# Patient Record
Sex: Male | Born: 1950 | Race: White | Hispanic: Yes | Marital: Married | Smoking: Former smoker
Health system: Southern US, Community
[De-identification: ages and names within clinical notes are randomized; demographics above are authoritative.]

## PROBLEM LIST (undated history)

## (undated) DIAGNOSIS — E119 Type 2 diabetes mellitus without complications: Secondary | ICD-10-CM

## (undated) DIAGNOSIS — E785 Hyperlipidemia, unspecified: Secondary | ICD-10-CM

## (undated) DIAGNOSIS — I10 Essential (primary) hypertension: Secondary | ICD-10-CM

## (undated) DIAGNOSIS — I2510 Atherosclerotic heart disease of native coronary artery without angina pectoris: Secondary | ICD-10-CM

## (undated) HISTORY — PX: PACEMAKER INSERTION: SHX728

## (undated) HISTORY — PX: CORONARY ARTERY BYPASS GRAFT: SHX141

---

## 2019-05-26 ENCOUNTER — Other Ambulatory Visit: Payer: Self-pay

## 2019-05-26 DIAGNOSIS — Z20822 Contact with and (suspected) exposure to covid-19: Secondary | ICD-10-CM

## 2019-05-26 NOTE — Addendum Note (Signed)
Addended by: Terence Lux on: 05/26/2019 10:39 AM   Modules accepted: Orders

## 2019-05-29 ENCOUNTER — Telehealth: Payer: Self-pay | Admitting: *Deleted

## 2019-05-29 LAB — SPECIMEN STATUS REPORT

## 2019-05-29 LAB — NOVEL CORONAVIRUS, NAA: SARS-CoV-2, NAA: DETECTED — AB

## 2019-05-29 NOTE — Telephone Encounter (Signed)
Call to daughter- [employer]  She took the whole family for testing due to exposure and everyone has tested + COVID. Patient was not having symptoms at time of testing- but has since developed minor/mild symptoms of COVID. Reviewed isolation recommendations, OTC treatment, and when to seek care from medical professional. Patient is visiting from [place]- so he does not have PCP in area and advised to see UC/health dept is needed. Patient is aware health dept may be following up with family

## 2019-06-02 ENCOUNTER — Emergency Department (HOSPITAL_COMMUNITY): Payer: HRSA Program

## 2019-06-02 ENCOUNTER — Encounter (HOSPITAL_COMMUNITY): Payer: Self-pay | Admitting: Emergency Medicine

## 2019-06-02 ENCOUNTER — Other Ambulatory Visit: Payer: Self-pay

## 2019-06-02 ENCOUNTER — Inpatient Hospital Stay (HOSPITAL_COMMUNITY)
Admission: EM | Admit: 2019-06-02 | Discharge: 2019-07-08 | DRG: 207 | Disposition: E | Payer: HRSA Program | Attending: Internal Medicine | Admitting: Internal Medicine

## 2019-06-02 DIAGNOSIS — Z8249 Family history of ischemic heart disease and other diseases of the circulatory system: Secondary | ICD-10-CM | POA: Diagnosis not present

## 2019-06-02 DIAGNOSIS — Z7189 Other specified counseling: Secondary | ICD-10-CM | POA: Diagnosis not present

## 2019-06-02 DIAGNOSIS — Z794 Long term (current) use of insulin: Secondary | ICD-10-CM | POA: Diagnosis not present

## 2019-06-02 DIAGNOSIS — J9601 Acute respiratory failure with hypoxia: Secondary | ICD-10-CM

## 2019-06-02 DIAGNOSIS — I472 Ventricular tachycardia: Secondary | ICD-10-CM | POA: Diagnosis not present

## 2019-06-02 DIAGNOSIS — R4189 Other symptoms and signs involving cognitive functions and awareness: Secondary | ICD-10-CM | POA: Diagnosis not present

## 2019-06-02 DIAGNOSIS — I429 Cardiomyopathy, unspecified: Secondary | ICD-10-CM | POA: Diagnosis present

## 2019-06-02 DIAGNOSIS — I2510 Atherosclerotic heart disease of native coronary artery without angina pectoris: Secondary | ICD-10-CM | POA: Diagnosis present

## 2019-06-02 DIAGNOSIS — J189 Pneumonia, unspecified organism: Secondary | ICD-10-CM | POA: Diagnosis not present

## 2019-06-02 DIAGNOSIS — E1110 Type 2 diabetes mellitus with ketoacidosis without coma: Secondary | ICD-10-CM | POA: Diagnosis not present

## 2019-06-02 DIAGNOSIS — Z9911 Dependence on respirator [ventilator] status: Secondary | ICD-10-CM | POA: Diagnosis not present

## 2019-06-02 DIAGNOSIS — D649 Anemia, unspecified: Secondary | ICD-10-CM | POA: Diagnosis present

## 2019-06-02 DIAGNOSIS — Z992 Dependence on renal dialysis: Secondary | ICD-10-CM

## 2019-06-02 DIAGNOSIS — N179 Acute kidney failure, unspecified: Secondary | ICD-10-CM

## 2019-06-02 DIAGNOSIS — Z87891 Personal history of nicotine dependence: Secondary | ICD-10-CM | POA: Diagnosis not present

## 2019-06-02 DIAGNOSIS — I5023 Acute on chronic systolic (congestive) heart failure: Secondary | ICD-10-CM | POA: Diagnosis present

## 2019-06-02 DIAGNOSIS — I469 Cardiac arrest, cause unspecified: Secondary | ICD-10-CM | POA: Diagnosis not present

## 2019-06-02 DIAGNOSIS — Z6835 Body mass index (BMI) 35.0-35.9, adult: Secondary | ICD-10-CM

## 2019-06-02 DIAGNOSIS — R680 Hypothermia, not associated with low environmental temperature: Secondary | ICD-10-CM | POA: Diagnosis not present

## 2019-06-02 DIAGNOSIS — U071 COVID-19: Principal | ICD-10-CM

## 2019-06-02 DIAGNOSIS — N170 Acute kidney failure with tubular necrosis: Secondary | ICD-10-CM | POA: Diagnosis present

## 2019-06-02 DIAGNOSIS — Z515 Encounter for palliative care: Secondary | ICD-10-CM | POA: Diagnosis not present

## 2019-06-02 DIAGNOSIS — Z950 Presence of cardiac pacemaker: Secondary | ICD-10-CM | POA: Diagnosis not present

## 2019-06-02 DIAGNOSIS — E669 Obesity, unspecified: Secondary | ICD-10-CM | POA: Diagnosis present

## 2019-06-02 DIAGNOSIS — J988 Other specified respiratory disorders: Secondary | ICD-10-CM | POA: Diagnosis not present

## 2019-06-02 DIAGNOSIS — J80 Acute respiratory distress syndrome: Secondary | ICD-10-CM | POA: Diagnosis not present

## 2019-06-02 DIAGNOSIS — D6489 Other specified anemias: Secondary | ICD-10-CM | POA: Diagnosis not present

## 2019-06-02 DIAGNOSIS — E875 Hyperkalemia: Secondary | ICD-10-CM | POA: Diagnosis not present

## 2019-06-02 DIAGNOSIS — R34 Anuria and oliguria: Secondary | ICD-10-CM | POA: Diagnosis not present

## 2019-06-02 DIAGNOSIS — E785 Hyperlipidemia, unspecified: Secondary | ICD-10-CM | POA: Diagnosis present

## 2019-06-02 DIAGNOSIS — E874 Mixed disorder of acid-base balance: Secondary | ICD-10-CM | POA: Diagnosis not present

## 2019-06-02 DIAGNOSIS — I5021 Acute systolic (congestive) heart failure: Secondary | ICD-10-CM | POA: Diagnosis not present

## 2019-06-02 DIAGNOSIS — R570 Cardiogenic shock: Secondary | ICD-10-CM | POA: Diagnosis not present

## 2019-06-02 DIAGNOSIS — G9341 Metabolic encephalopathy: Secondary | ICD-10-CM | POA: Diagnosis not present

## 2019-06-02 DIAGNOSIS — D696 Thrombocytopenia, unspecified: Secondary | ICD-10-CM | POA: Diagnosis not present

## 2019-06-02 DIAGNOSIS — I110 Hypertensive heart disease with heart failure: Secondary | ICD-10-CM | POA: Diagnosis present

## 2019-06-02 DIAGNOSIS — Z66 Do not resuscitate: Secondary | ICD-10-CM | POA: Diagnosis not present

## 2019-06-02 DIAGNOSIS — Z978 Presence of other specified devices: Secondary | ICD-10-CM

## 2019-06-02 DIAGNOSIS — J1289 Other viral pneumonia: Secondary | ICD-10-CM | POA: Diagnosis present

## 2019-06-02 DIAGNOSIS — Z7982 Long term (current) use of aspirin: Secondary | ICD-10-CM

## 2019-06-02 DIAGNOSIS — I10 Essential (primary) hypertension: Secondary | ICD-10-CM | POA: Diagnosis present

## 2019-06-02 DIAGNOSIS — I4901 Ventricular fibrillation: Secondary | ICD-10-CM | POA: Diagnosis not present

## 2019-06-02 DIAGNOSIS — Z951 Presence of aortocoronary bypass graft: Secondary | ICD-10-CM

## 2019-06-02 DIAGNOSIS — R0603 Acute respiratory distress: Secondary | ICD-10-CM

## 2019-06-02 DIAGNOSIS — R0902 Hypoxemia: Secondary | ICD-10-CM

## 2019-06-02 DIAGNOSIS — E1165 Type 2 diabetes mellitus with hyperglycemia: Secondary | ICD-10-CM | POA: Diagnosis not present

## 2019-06-02 DIAGNOSIS — Z6831 Body mass index (BMI) 31.0-31.9, adult: Secondary | ICD-10-CM

## 2019-06-02 DIAGNOSIS — G931 Anoxic brain damage, not elsewhere classified: Secondary | ICD-10-CM | POA: Diagnosis not present

## 2019-06-02 DIAGNOSIS — E119 Type 2 diabetes mellitus without complications: Secondary | ICD-10-CM | POA: Diagnosis not present

## 2019-06-02 DIAGNOSIS — E43 Unspecified severe protein-calorie malnutrition: Secondary | ICD-10-CM | POA: Diagnosis present

## 2019-06-02 HISTORY — DX: Essential (primary) hypertension: I10

## 2019-06-02 HISTORY — DX: Hyperlipidemia, unspecified: E78.5

## 2019-06-02 HISTORY — DX: Type 2 diabetes mellitus without complications: E11.9

## 2019-06-02 HISTORY — DX: Atherosclerotic heart disease of native coronary artery without angina pectoris: I25.10

## 2019-06-02 LAB — TROPONIN I (HIGH SENSITIVITY)
Troponin I (High Sensitivity): 123 ng/L (ref <18)
Troponin I (High Sensitivity): 127 ng/L (ref <18)

## 2019-06-02 LAB — COMPREHENSIVE METABOLIC PANEL
ALT: 34 U/L (ref 0–44)
AST: 109 U/L — ABNORMAL HIGH (ref 15–41)
Albumin: 2.9 g/dL — ABNORMAL LOW (ref 3.5–5.0)
Alkaline Phosphatase: 136 U/L — ABNORMAL HIGH (ref 38–126)
Anion gap: 19 — ABNORMAL HIGH (ref 5–15)
BUN: 69 mg/dL — ABNORMAL HIGH (ref 8–23)
CO2: 20 mmol/L — ABNORMAL LOW (ref 22–32)
Calcium: 7.6 mg/dL — ABNORMAL LOW (ref 8.9–10.3)
Chloride: 95 mmol/L — ABNORMAL LOW (ref 98–111)
Creatinine, Ser: 2.34 mg/dL — ABNORMAL HIGH (ref 0.61–1.24)
GFR calc Af Amer: 33 mL/min — ABNORMAL LOW (ref >60)
GFR calc non Af Amer: 28 mL/min — ABNORMAL LOW (ref >60)
Glucose, Bld: 264 mg/dL — ABNORMAL HIGH (ref 70–99)
Potassium: 4.1 mmol/L (ref 3.5–5.1)
Sodium: 134 mmol/L — ABNORMAL LOW (ref 135–145)
Total Bilirubin: 0.8 mg/dL (ref 0.3–1.2)
Total Protein: 7 g/dL (ref 6.5–8.1)

## 2019-06-02 LAB — CBC WITH DIFFERENTIAL/PLATELET
Abs Immature Granulocytes: 0.03 10*3/uL (ref 0.00–0.07)
Basophils Absolute: 0 10*3/uL (ref 0.0–0.1)
Basophils Relative: 0 %
Eosinophils Absolute: 0 10*3/uL (ref 0.0–0.5)
Eosinophils Relative: 0 %
HCT: 39.7 % (ref 39.0–52.0)
Hemoglobin: 13.4 g/dL (ref 13.0–17.0)
Immature Granulocytes: 1 %
Lymphocytes Relative: 5 %
Lymphs Abs: 0.3 10*3/uL — ABNORMAL LOW (ref 0.7–4.0)
MCH: 30.1 pg (ref 26.0–34.0)
MCHC: 33.8 g/dL (ref 30.0–36.0)
MCV: 89.2 fL (ref 80.0–100.0)
Monocytes Absolute: 0.2 10*3/uL (ref 0.1–1.0)
Monocytes Relative: 3 %
Neutro Abs: 5.6 10*3/uL (ref 1.7–7.7)
Neutrophils Relative %: 91 %
Platelets: 115 10*3/uL — ABNORMAL LOW (ref 150–400)
RBC: 4.45 MIL/uL (ref 4.22–5.81)
RDW: 14.1 % (ref 11.5–15.5)
WBC: 6.1 10*3/uL (ref 4.0–10.5)
nRBC: 0 % (ref 0.0–0.2)

## 2019-06-02 LAB — STREP PNEUMONIAE URINARY ANTIGEN: Strep Pneumo Urinary Antigen: NEGATIVE

## 2019-06-02 LAB — FERRITIN: Ferritin: 2645 ng/mL — ABNORMAL HIGH (ref 24–336)

## 2019-06-02 LAB — BASIC METABOLIC PANEL
Anion gap: 17 — ABNORMAL HIGH (ref 5–15)
Anion gap: 15 (ref 5–15)
BUN: 68 mg/dL — ABNORMAL HIGH (ref 8–23)
BUN: 59 mg/dL — ABNORMAL HIGH (ref 8–23)
CO2: 20 mmol/L — ABNORMAL LOW (ref 22–32)
CO2: 20 mmol/L — ABNORMAL LOW (ref 22–32)
Calcium: 7.5 mg/dL — ABNORMAL LOW (ref 8.9–10.3)
Calcium: 7.8 mg/dL — ABNORMAL LOW (ref 8.9–10.3)
Chloride: 100 mmol/L (ref 98–111)
Chloride: 104 mmol/L (ref 98–111)
Creatinine, Ser: 2 mg/dL — ABNORMAL HIGH (ref 0.61–1.24)
Creatinine, Ser: 1.56 mg/dL — ABNORMAL HIGH (ref 0.61–1.24)
GFR calc Af Amer: 39 mL/min — ABNORMAL LOW (ref >60)
GFR calc Af Amer: 53 mL/min — ABNORMAL LOW (ref >60)
GFR calc non Af Amer: 34 mL/min — ABNORMAL LOW (ref >60)
GFR calc non Af Amer: 46 mL/min — ABNORMAL LOW (ref >60)
Glucose, Bld: 175 mg/dL — ABNORMAL HIGH (ref 70–99)
Glucose, Bld: 187 mg/dL — ABNORMAL HIGH (ref 70–99)
Potassium: 4.2 mmol/L (ref 3.5–5.1)
Potassium: 4.3 mmol/L (ref 3.5–5.1)
Sodium: 137 mmol/L (ref 135–145)
Sodium: 139 mmol/L (ref 135–145)

## 2019-06-02 LAB — URINALYSIS, ROUTINE W REFLEX MICROSCOPIC
Bilirubin Urine: NEGATIVE
Glucose, UA: 50 mg/dL — AB
Ketones, ur: 5 mg/dL — AB
Leukocytes,Ua: NEGATIVE
Nitrite: NEGATIVE
Protein, ur: 100 mg/dL — AB
Specific Gravity, Urine: 1.017 (ref 1.005–1.030)
pH: 5 (ref 5.0–8.0)

## 2019-06-02 LAB — LACTIC ACID, PLASMA
Lactic Acid, Venous: 2.3 mmol/L (ref 0.5–1.9)
Lactic Acid, Venous: 1.2 mmol/L (ref 0.5–1.9)

## 2019-06-02 LAB — MRSA PCR SCREENING: MRSA by PCR: NEGATIVE

## 2019-06-02 LAB — GLUCOSE, CAPILLARY
Glucose-Capillary: 150 mg/dL — ABNORMAL HIGH (ref 70–99)
Glucose-Capillary: 195 mg/dL — ABNORMAL HIGH (ref 70–99)
Glucose-Capillary: 168 mg/dL — ABNORMAL HIGH (ref 70–99)
Glucose-Capillary: 157 mg/dL — ABNORMAL HIGH (ref 70–99)
Glucose-Capillary: 187 mg/dL — ABNORMAL HIGH (ref 70–99)
Glucose-Capillary: 181 mg/dL — ABNORMAL HIGH (ref 70–99)

## 2019-06-02 LAB — TYPE AND SCREEN
ABO/RH(D): O POS
ABO/RH(D): O POS
Antibody Screen: NEGATIVE
Antibody Screen: NEGATIVE

## 2019-06-02 LAB — BRAIN NATRIURETIC PEPTIDE
B Natriuretic Peptide: 108.4 pg/mL — ABNORMAL HIGH (ref 0.0–100.0)
B Natriuretic Peptide: 92.8 pg/mL (ref 0.0–100.0)

## 2019-06-02 LAB — C-REACTIVE PROTEIN: CRP: 18.6 mg/dL — ABNORMAL HIGH (ref <1.0)

## 2019-06-02 LAB — PROTIME-INR
INR: 1 (ref 0.8–1.2)
Prothrombin Time: 12.8 seconds (ref 11.4–15.2)

## 2019-06-02 LAB — CBG MONITORING, ED
Glucose-Capillary: 183 mg/dL — ABNORMAL HIGH (ref 70–99)
Glucose-Capillary: 147 mg/dL — ABNORMAL HIGH (ref 70–99)
Glucose-Capillary: 153 mg/dL — ABNORMAL HIGH (ref 70–99)

## 2019-06-02 LAB — LACTATE DEHYDROGENASE: LDH: 1112 U/L — ABNORMAL HIGH (ref 98–192)

## 2019-06-02 LAB — ABO/RH: ABO/RH(D): O POS

## 2019-06-02 LAB — PROCALCITONIN: Procalcitonin: 0.72 ng/mL

## 2019-06-02 LAB — SEDIMENTATION RATE: Sed Rate: 94 mm/hr — ABNORMAL HIGH (ref 0–16)

## 2019-06-02 LAB — SARS CORONAVIRUS 2 BY RT PCR (HOSPITAL ORDER, PERFORMED IN ~~LOC~~ HOSPITAL LAB): SARS Coronavirus 2: POSITIVE — AB

## 2019-06-02 MED ORDER — HYDRALAZINE HCL 20 MG/ML IJ SOLN
10.0000 mg | Freq: Four times a day (QID) | INTRAMUSCULAR | Status: DC | PRN
  Administered 2019-06-05 – 2019-06-06 (×2): 10 mg via INTRAVENOUS
  Filled 2019-06-02 (×2): qty 1

## 2019-06-02 MED ORDER — TOCILIZUMAB 400 MG/20ML IV SOLN
8.0000 mg/kg | Freq: Once | INTRAVENOUS | Status: AC
  Administered 2019-06-02: 16:00:00 580 mg via INTRAVENOUS
  Filled 2019-06-02: qty 20

## 2019-06-02 MED ORDER — DEXTROSE-NACL 5-0.45 % IV SOLN
INTRAVENOUS | Status: DC
  Administered 2019-06-02 – 2019-06-05 (×5): via INTRAVENOUS

## 2019-06-02 MED ORDER — FAMOTIDINE 20 MG PO TABS
20.0000 mg | ORAL_TABLET | Freq: Every day | ORAL | Status: DC
  Administered 2019-06-02 – 2019-06-03 (×2): 20 mg via ORAL
  Filled 2019-06-02 (×2): qty 1

## 2019-06-02 MED ORDER — INSULIN REGULAR BOLUS VIA INFUSION
0.0000 [IU] | Freq: Three times a day (TID) | INTRAVENOUS | Status: DC
  Filled 2019-06-02: qty 10

## 2019-06-02 MED ORDER — ADULT MULTIVITAMIN W/MINERALS CH
1.0000 | ORAL_TABLET | Freq: Every day | ORAL | Status: DC
  Administered 2019-06-02 – 2019-06-08 (×6): 1 via ORAL
  Filled 2019-06-02 (×6): qty 1

## 2019-06-02 MED ORDER — DOCUSATE SODIUM 100 MG PO CAPS
100.0000 mg | ORAL_CAPSULE | Freq: Every day | ORAL | Status: DC
  Administered 2019-06-02 – 2019-06-04 (×3): 100 mg via ORAL
  Filled 2019-06-02 (×3): qty 1

## 2019-06-02 MED ORDER — ATORVASTATIN CALCIUM 40 MG PO TABS
40.0000 mg | ORAL_TABLET | Freq: Every day | ORAL | Status: DC
  Administered 2019-06-02 – 2019-06-04 (×3): 40 mg via ORAL
  Filled 2019-06-02 (×3): qty 1

## 2019-06-02 MED ORDER — VITAMIN C 500 MG PO TABS
1000.0000 mg | ORAL_TABLET | Freq: Every day | ORAL | Status: DC
  Administered 2019-06-02 – 2019-06-04 (×3): 1000 mg via ORAL
  Filled 2019-06-02 (×5): qty 2

## 2019-06-02 MED ORDER — ENOXAPARIN SODIUM 30 MG/0.3ML ~~LOC~~ SOLN: 30.0000 mg | SUBCUTANEOUS | Status: DC

## 2019-06-02 MED ORDER — VITAMIN D 25 MCG (1000 UNIT) PO TABS
1000.0000 [IU] | ORAL_TABLET | Freq: Every day | ORAL | Status: DC
  Administered 2019-06-02 – 2019-06-04 (×3): 1000 [IU] via ORAL
  Filled 2019-06-02 (×4): qty 1

## 2019-06-02 MED ORDER — SODIUM CHLORIDE 0.9 % IV BOLUS
500.0000 mL | Freq: Once | INTRAVENOUS | Status: AC
  Administered 2019-06-02: 500 mL via INTRAVENOUS

## 2019-06-02 MED ORDER — ASPIRIN EC 81 MG PO TBEC
81.0000 mg | DELAYED_RELEASE_TABLET | Freq: Every day | ORAL | Status: DC
  Administered 2019-06-02 – 2019-06-04 (×3): 81 mg via ORAL
  Filled 2019-06-02 (×4): qty 1

## 2019-06-02 MED ORDER — SODIUM CHLORIDE 0.9 % IV SOLN
500.0000 mg | Freq: Every day | INTRAVENOUS | Status: DC
  Administered 2019-06-02: 500 mg via INTRAVENOUS
  Filled 2019-06-02: qty 500

## 2019-06-02 MED ORDER — VITAMIN D 25 MCG (1000 UNIT) PO TABS: 1000.0000 [IU] | ORAL_TABLET | Freq: Every day | ORAL | Status: DC

## 2019-06-02 MED ORDER — DEXTROSE 50 % IV SOLN: 25.0000 mL | INTRAVENOUS | Status: DC | PRN

## 2019-06-02 MED ORDER — CHLORHEXIDINE GLUCONATE CLOTH 2 % EX PADS
6.0000 | MEDICATED_PAD | Freq: Every day | CUTANEOUS | Status: DC
  Administered 2019-06-03 – 2019-06-08 (×6): 6 via TOPICAL

## 2019-06-02 MED ORDER — SODIUM CHLORIDE 0.9 % IV SOLN: INTRAVENOUS | Status: DC

## 2019-06-02 MED ORDER — METOPROLOL SUCCINATE ER 50 MG PO TB24
50.0000 mg | ORAL_TABLET | Freq: Every day | ORAL | Status: DC
  Administered 2019-06-02 – 2019-06-04 (×3): 50 mg via ORAL
  Filled 2019-06-02 (×4): qty 1

## 2019-06-02 MED ORDER — SODIUM CHLORIDE 0.9 % IV SOLN
2.0000 g | Freq: Every day | INTRAVENOUS | Status: DC
  Administered 2019-06-02: 2 g via INTRAVENOUS
  Filled 2019-06-02: qty 20

## 2019-06-02 MED ORDER — ORAL CARE MOUTH RINSE
15.0000 mL | Freq: Two times a day (BID) | OROMUCOSAL | Status: DC
  Administered 2019-06-02 – 2019-06-03 (×2): 15 mL via OROMUCOSAL

## 2019-06-02 MED ORDER — DEXAMETHASONE SODIUM PHOSPHATE 10 MG/ML IJ SOLN
6.0000 mg | Freq: Every day | INTRAMUSCULAR | Status: DC
  Administered 2019-06-02 – 2019-06-03 (×2): 6 mg via INTRAVENOUS
  Filled 2019-06-02 (×2): qty 1

## 2019-06-02 MED ORDER — INSULIN REGULAR(HUMAN) IN NACL 100-0.9 UT/100ML-% IV SOLN
INTRAVENOUS | Status: DC
  Administered 2019-06-02: 09:00:00 1.2 [IU]/h via INTRAVENOUS
  Filled 2019-06-02: qty 100

## 2019-06-02 MED ORDER — SODIUM CHLORIDE 0.9 % IV SOLN
100.0000 mg | INTRAVENOUS | Status: AC
  Administered 2019-06-03 – 2019-06-06 (×4): 100 mg via INTRAVENOUS
  Filled 2019-06-02 (×4): qty 20

## 2019-06-02 MED ORDER — SODIUM CHLORIDE 0.9 % IV SOLN
200.0000 mg | Freq: Once | INTRAVENOUS | Status: AC
  Administered 2019-06-02: 200 mg via INTRAVENOUS
  Filled 2019-06-02: qty 40

## 2019-06-02 MED ORDER — HEPARIN SODIUM (PORCINE) 10000 UNIT/ML IJ SOLN
7500.0000 [IU] | Freq: Three times a day (TID) | INTRAMUSCULAR | Status: DC
  Administered 2019-06-02 – 2019-06-03 (×3): 7500 [IU] via SUBCUTANEOUS
  Filled 2019-06-02 (×3): qty 1

## 2019-06-02 NOTE — ED Notes (Signed)
Attempted report to [facility] x2.

## 2019-06-02 NOTE — Progress Notes (Signed)
CRITICAL VALUE ALERT  Critical Value:  Troponin = 123  Date & Time Notied:  05/20/2019 1306  Provider Notified: Dr. Maryland Pink  Orders Received/Actions taken: Awaiting response at this time.

## 2019-06-02 NOTE — Progress Notes (Signed)
Pt's SpO2's are upper 80's on 15L [place], have changed his Admit order to ICU at [facility].    Kelly Splinter MD  [facility]  pgr [phone]  06/06/2019, 10:39 AM

## 2019-06-02 NOTE — ED Notes (Signed)
[facility] at bedside 

## 2019-06-02 NOTE — ED Notes (Signed)
Date and time results received: 05/11/2019 2:07 AM  (use smartphrase ".now" to insert current time)  Test: Lactic Acid Critical Value: 2.3  Name of Provider Notified: [place]  Orders Received? Or Actions Taken?: none

## 2019-06-02 NOTE — Progress Notes (Signed)
PROGRESS NOTE  Robert Mcdaniel UMP:[id number] DOB: 1951/10/06 DOA: 05/12/2019  PCP: Patient, No Pcp Per  Brief History/Interval Summary: 68 year old male who speaks only Spanish with a past medical history of coronary artery disease, diabetes mellitus type 2 on insulin, possible chronic kidney disease unspecified stage presented with complains of shortness of breath dry cough.  Patient had nausea vomiting a few days ago.  Patient was found to be positive for COVID-19.  Chest x-ray showed pneumonia.  Patient was hospitalized for further management.  He was hypoxic.    Reason for Visit: Acute respiratory disease due to COVID-19  Consultants: Pulmonology  Procedures: None  Antibiotics: Anti-infectives (From admission, onward)   Start     Dose/Rate Route Frequency Ordered Stop   06/03/19 1100  remdesivir 100 mg in sodium chloride 0.9 % 250 mL IVPB     100 mg 500 mL/hr over 30 Minutes Intravenous Every 24 hours 05/21/2019 0927 06/07/19 1059   05/09/2019 1100  remdesivir 200 mg in sodium chloride 0.9 % 250 mL IVPB     200 mg 500 mL/hr over 30 Minutes Intravenous Once 05/12/2019 0927 05/13/2019 1124   05/24/2019 0500  azithromycin (ZITHROMAX) 500 mg in sodium chloride 0.9 % 250 mL IVPB  Status:  Discontinued     500 mg 250 mL/hr over 60 Minutes Intravenous Daily 05/27/2019 0416 05/21/2019 1247   05/26/2019 0430  cefTRIAXone (ROCEPHIN) 2 g in sodium chloride 0.9 % 100 mL IVPB  Status:  Discontinued     2 g 200 mL/hr over 30 Minutes Intravenous Daily 05/27/2019 0416 05/20/2019 1247       Subjective/Interval History: Interpreter services were utilized.  Patient mentions that he is feeling fine.  He denies any chest pain currently.  Shortness of breath is slightly better.  Occasional cough.  He did have nausea vomiting a few days ago but none currently.  Denies any diarrhea.  He states that he has been told that he has kidney problem but he is unable to provide further details.    Assessment/Plan:  Acute  Hypoxic Resp. Failure due to Acute Covid 19 Viral Illness  COVID-19 Labs  Recent Labs    05/18/2019 0413 05/09/2019 0416  FERRITIN  --  2,645*  LDH 1,112*  --   CRP  --  18.6*    Lab Results  Component Value Date   SARSCOV2NAA POSITIVE (A) 05/31/2019   SARSCOV2NAA Detected (A) 05/26/2019     Fever: Afebrile Oxygen requirements: Currently on heated HF [place].  30 L/min.  100% FiO2.  Saturating in the early 90s. Antibacterials: Was given ceftriaxone and azithromycin in the ED.  See below Remdesivir: Initiated.  Day 1 today Steroids: Dexamethasone 6 mg daily Diuretics: Not on diuretics on a scheduled basis Actemra: 1 dose to be given today Vitamin C and Zinc: Initiate DVT Prophylaxis: Heparin 7500 units every 8 hours  Research Studies: Not enrolled into any research studies yet.  Patient has extensive pneumonia.  He has high O2 requirements currently.  But he is stable.  He has been started on Remdesivir and steroids.  He will be given 1 dose of Actemra.  This was discussed in detail with the patient via interpreter.  He denies any history of liver disease.  No history of tuberculosis.  Does not drink alcohol.  Incentive spirometry.  Prone positioning as much as possible.  CRP is noted to be elevated at 18.6.  Ferritin 2645.  We will continue to trend.  WBC is normal.  Procalcitonin 0.72.  He did get antibiotics earlier today.  Recheck procalcitonin tomorrow.  Lactic acid level was 2.3 initially.  Improved to 1.2.  Interpreter services utilized.  The treatment plan and use of medications and known side effects were discussed with patient.  Some of the medications used are based on case reports/anecdotal data which are not peer-reviewed and has not been studied using randomized control trials.  Complete risks and long-term side effects are unknown, however in the best clinical judgment they seem to be of some benefit.  Patient agrees with the treatment plan and want to receive these treatments  as indicated.  Acute renal failure Baseline renal function is not known.  Patient could have CKD based on his history however this is unspecified.  Patient has been making urine which is darker than usual.  This could all be acute renal failure due to hypovolemia.  Continue with IV fluids.  Monitor urine output.  Follow-up labs.  History of diabetes mellitus type 2, uncontrolled with hyperglycemia Patient was noted to have elevated anion gap.  Concern was for DKA.  Patient started on insulin infusion.  We will continue insulin infusion for now as the patient's glycemic control will likely get worse on steroids.  Anion gap was 19.  Improved to 17.  Check HbA1c.  Follow-up on labs.  History of coronary artery disease status post CABG EKG shows nonspecific T wave changes.  No old EKG available for comparison.  Troponin noted to be mildly abnormal.  Patient denies any chest pain.  Significance of these findings not clear.  We will recheck another troponin level.  Monitor for signs or symptoms of ischemia.  Continue aspirin beta blocker statin.  Obesity  Estimated body mass index is 31.25 kg/m as calculated from the following:   Height as of this encounter: 5' (1.524 m).   Weight as of this encounter: 72.6 kg.   DVT Prophylaxis: Heparin subcutaneously PUD Prophylaxis: Initiate Pepcid Code Status: Full code Family Communication: Discussed with the patient Disposition Plan: Continue ICU monitoring for now   Medications:  Scheduled: . aspirin EC  81 mg Oral Daily  . atorvastatin  40 mg Oral Daily  . cholecalciferol  1,000 Units Oral Daily  . dexamethasone (DECADRON) injection  6 mg Intravenous Daily  . docusate sodium  100 mg Oral Daily  . heparin injection (subcutaneous)  7,500 Units Subcutaneous Q8H  . insulin regular  0-10 Units Intravenous TID WC  . metoprolol succinate  50 mg Oral Daily  . multivitamin with minerals  1 tablet Oral Daily  . vitamin C  1,000 mg Oral Daily    Continuous: . dextrose 5 % and 0.45% NaCl 100 mL/hr at 05/13/2019 1311  . insulin 0.9 Units/hr (05/08/2019 1226)  . [START ON 06/03/2019] remdesivir 100 mg in NS 250 mL    . tocilizumab (ACTEMRA) IV     YQM:VHQIONGE   Objective:  Vital Signs  Vitals:   05/15/2019 1107 05/12/2019 1159 05/13/2019 1200 05/19/2019 1215  BP:   (!) 171/84 (!) 175/75  Pulse: 75 78 79 79  Resp: (!) 27 (!) 25 (!) 26 18  Temp:   97.8 F (36.6 C)   TempSrc:   Oral   SpO2: 90% 91% 91% 93%  Weight:      Height:        Intake/Output Summary (Last 24 hours) at 06/01/2019 1401 Last data filed at 05/14/2019 1200 Gross per 24 hour  Intake 350 ml  Output 450 ml  Net -100 ml  Filed Weights   05/14/2019 0801  Weight: 72.6 kg    General appearance: Awake alert.  In no distress Resp: Mildly tachypneic at rest.  Coarse breath sounds bilaterally with crackles at the bases.  No wheezing or rhonchi.   Cardio: S1-S2 is normal regular.  No S3-S4.  No rubs murmurs or bruit GI: Abdomen is soft.  Nontender nondistended.  Bowel sounds are present normal.  No masses organomegaly Extremities: No edema.  Full range of motion of lower extremities. Neurologic: Alert and oriented x3.  No focal neurological deficits.    Lab Results:  Data Reviewed: I have personally reviewed following labs and imaging studies  CBC: Recent Labs  Lab 05/13/2019 0055  WBC 6.1  NEUTROABS 5.6  HGB 13.4  HCT 39.7  MCV 89.2  PLT 115*    Basic Metabolic Panel: Recent Labs  Lab 05/13/2019 0055 05/20/2019 1235  NA 134* 137  K 4.1 4.2  CL 95* 100  CO2 20* 20*  GLUCOSE 264* 175*  BUN 69* 68*  CREATININE 2.34* 2.00*  CALCIUM 7.6* 7.5*    GFR: Estimated Creatinine Clearance: 30.7 mL/min (A) (by C-G formula based on SCr of 2 mg/dL (H)).  Liver Function Tests: Recent Labs  Lab 05/08/2019 0055  AST 109*  ALT 34  ALKPHOS 136*  BILITOT 0.8  PROT 7.0  ALBUMIN 2.9*     Coagulation Profile: Recent Labs  Lab 06/05/2019 0055  INR 1.0      CBG: Recent Labs  Lab 05/14/2019 0842 05/08/2019 1029 05/14/2019 1129 05/21/2019 1222  GLUCAP 183* 147* 153* 150*    Anemia Panel: Recent Labs    05/21/2019 0416  FERRITIN 2,645*    Recent Results (from the past 240 hour(s))  Novel Coronavirus, NAA ([facility])     Status: Abnormal   Collection Time: 05/26/19 12:00 AM  Result Value Ref Range Status   SARS-CoV-2, NAA Detected (A) Not Detected Final    Comment: This test was developed and its performance characteristics determined by [employer]. This test has not been FDA cleared or approved. This test has been authorized by FDA under an Emergency Use Authorization (EUA). This test is only authorized for the duration of time the declaration that circumstances exist justifying the authorization of the emergency use of in vitro diagnostic tests for detection of SARS-CoV-2 virus and/or diagnosis of COVID-19 infection under section 564(b)(1) of the Act, 21 U.S.C. 272ZDG-6(Y)(4), unless the authorization is terminated or revoked sooner. When diagnostic testing is negative, the possibility of a false negative result should be considered in the context of a patient's recent exposures and the presence of clinical signs and symptoms consistent with COVID-19. An individual without symptoms of COVID-19 and who is not shedding SARS-CoV-2 virus would expect to have a negative (not detected) result in this assay.   Culture, blood (Routine x 2)     Status: None (Preliminary result)   Collection Time: 05/25/2019 12:59 AM   Specimen: BLOOD RIGHT FOREARM  Result Value Ref Range Status   Specimen Description   Final    BLOOD RIGHT FOREARM Performed at [facility], [place] [address] [place], [place] [address]    Special Requests   Final    BOTTLES DRAWN AEROBIC AND ANAEROBIC Blood Culture adequate volume Performed at [place] [address], [place], [place] [address]    Culture PENDING  Incomplete   Report  Status PENDING  Incomplete  SARS Coronavirus 2 (CEPHEID - Performed in [facility] hospital lab),  Hosp Order     Status: Abnormal   Collection Time: 05/25/2019  3:52 AM   Specimen: Nasopharyngeal Swab  Result Value Ref Range Status   SARS Coronavirus 2 POSITIVE (A) NEGATIVE Final    Comment: RESULT CALLED TO, READ BACK BY AND VERIFIED WITH: S.BINGHAM RN AT 0347 ON 05/07/2019 BY S.VANHOORNE (NOTE) If result is NEGATIVE SARS-CoV-2 target nucleic acids are NOT DETECTED. The SARS-CoV-2 RNA is generally detectable in upper and lower  respiratory specimens during the acute phase of infection. The lowest  concentration of SARS-CoV-2 viral copies this assay can detect is 250  copies / mL. A negative result does not preclude SARS-CoV-2 infection  and should not be used as the sole basis for treatment or other  patient management decisions.  A negative result may occur with  improper specimen collection / handling, submission of specimen other  than nasopharyngeal swab, presence of viral mutation(s) within the  areas targeted by this assay, and inadequate number of viral copies  (<250 copies / mL). A negative result must be combined with clinical  observations, patient history, and epidemiological information. If result is POSITIVE SARS-CoV-2 target nucleic acids are  DETECTED. The SARS-CoV-2 RNA is generally detectable in upper and lower  respiratory specimens during the acute phase of infection.  Positive  results are indicative of active infection with SARS-CoV-2.  Clinical  correlation with patient history and other diagnostic information is  necessary to determine patient infection status.  Positive results do  not rule out bacterial infection or co-infection with other viruses. If result is PRESUMPTIVE POSTIVE SARS-CoV-2 nucleic acids MAY BE PRESENT.   A presumptive positive result was obtained on the submitted specimen  and confirmed on repeat testing.  While 2019 novel coronavirus   (SARS-CoV-2) nucleic acids may be present in the submitted sample  additional confirmatory testing may be necessary for epidemiological  and / or clinical management purposes  to differentiate between  SARS-CoV-2 and other Sarbecovirus currently known to infect humans.  If clinically indicated additional testing with an alternate test  methodology  [phone]) is advised. The SARS-CoV-2 RNA is generally  detectable in upper and lower respiratory specimens during the acute  phase of infection. The expected result is Negative. Fact Sheet for Patients:  StrictlyIdeas.no Fact Sheet for Healthcare Providers: BankingDealers.co.za This test is not yet approved or cleared by the [place] FDA and has been authorized for detection and/or diagnosis of SARS-CoV-2 by FDA under an Emergency Use Authorization (EUA).  This EUA will remain in effect (meaning this test can be used) for the duration of the COVID-19 declaration under Section 564(b)(1) of the Act, 21 U.S.C. section 360bbb-3(b)(1), unless the authorization is terminated or revoked sooner. Performed at [facility], [place] [address], [place], [place] [address]       Radiology Studies: Dg Chest Portable 1 View  Result Date: 05/13/2019 CLINICAL DATA:  Upper back pain. COVID-19 positive with test 1 week ago. EXAM: PORTABLE CHEST 1 VIEW COMPARISON:  None. FINDINGS: Heart size is exaggerated by low lung volumes. Diffuse interstitial and airspace disease is present. Disease is worse on the left upper lobe and right lower lobe. 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 The patient is status post median sternotomy. IMPRESSION: 1. Low lung volumes with diffuse interstitial and airspace disease bilaterally concerning for pneumonia. Electronically Signed   By: San Morelle M.D.   On: 05/16/2019 02:41       LOS: 0 days   Bonnielee Haff  [facility] Pager on [email or web address]   05/11/2019, 2:01 PM

## 2019-06-02 NOTE — ED Notes (Signed)
Pt aware that urine sample is needed. Unable to provide at this time

## 2019-06-02 NOTE — ED Notes (Signed)
Attempted report to [facility]ICU

## 2019-06-02 NOTE — H&P (Addendum)
TRH H&P    Patient Demographics:    Robert Mcdaniel, is a 68 y.o. male  MRN: [id number]  DOB - 1950/12/30  Admit Date - 05/23/2019  Referring MD/NP/PA:  Merrily Pew  Outpatient Primary MD for the patient is Patient, No Pcp Per  Patient coming from:  home  Chief complaint- dyspnea   HPI:    Robert Mcdaniel  is a 68 y.o. male,  w Covid -19 positive 05/26/2019, presents with upper back pain radiating from shoulder to shoulder per RN notes, but doesn't mention this to me.  Pt notes prior exposure to covid and also dry cough. Slight dyspnea.  Pt denies fever, chills, cp, palp, n/v, diarrhea, brbpr, black stool, dysuria, hematuria.  Pt presented for evaluation of Covid +  In ED,  T 97.6  P 78  R 25  Bp 138/60  Pox 64% on RA  CXR IMPRESSION: 1. Low lung volumes with diffuse interstitial and airspace disease bilaterally concerning for pneumonia.  Na 134, K 4.1,   Bun 69, Creatinine 2.34 Alb 2.9 Ast 109, Alt 34, Alk phos 136 T. Bili 0.8 Glucose 264  Wbc 6.1, hgb 13.4, Plt 115 INR 1.0 Lactic acid 2.3-> 1.2   Blood culture x2 pending  Pt will be admitted for acute respiratory failure secondary to CAP, covid -19 +, as well as Acute renal failure and Abnormal liver function.     Review of systems:    In addition to the HPI above,  No Fever-chills, No Headache, No changes with Vision or hearing, No problems swallowing food or Liquids, No Chest pain, No Abdominal pain, No Nausea or Vomiting, bowel movements are regular, No Blood in stool or Urine, No dysuria, No new skin rashes or bruises, No new joints pains-aches,  No new weakness, tingling, numbness in any extremity, No recent weight gain or loss, No polyuria, polydypsia or polyphagia, No significant Mental Stressors.  All other systems reviewed and are negative.    Past History of the following :    Past Medical History:  Diagnosis Date   . CAD (coronary artery disease)   . Diabetes ([place])   . Hyperlipidemia   . Hypertension       Past Surgical History:  Procedure Laterality Date  . CORONARY ARTERY BYPASS GRAFT    . PACEMAKER INSERTION        Social History:      Social History   Tobacco Use  . Smoking status: Former Research scientist (life sciences)  . Smokeless tobacco: Never Used  Substance Use Topics  . Alcohol use: Never    Frequency: Never       Family History :     Family History  Problem Relation Age of Onset  . Heart attack Father        Home Medications:   Prior to Admission medications   Medication Sig Start Date End Date Taking? Authorizing Provider  acetaminophen (TYLENOL) 500 MG tablet Take 1,000 mg by mouth every 6 (six) hours as needed for moderate pain or fever.   Yes [provider]  Ascorbic Acid (VITAMIN C) 1000 MG tablet Take 1,000 mg by mouth daily.   Yes [provider]  aspirin EC 81 MG tablet Take 81 mg by mouth daily.   Yes [provider]  atorvastatin (LIPITOR) 40 MG tablet Take 40 mg by mouth daily.   Yes [provider]  cholecalciferol (VITAMIN D3) 25 MCG (1000 UT) tablet Take 1,000 Units by mouth daily.   Yes [provider]  docusate sodium (COLACE) 100 MG capsule Take 100 mg by mouth daily.   Yes [provider]  insulin glargine (LANTUS) 100 UNIT/ML injection Inject 43-45 Units into the skin daily as needed. High Sugar. Only take if >120.   Yes [provider]  metoprolol succinate (TOPROL-XL) 50 MG 24 hr tablet Take 50 mg by mouth daily. Take with or immediately following a meal.   Yes [provider]  Multiple Vitamin (MULTIVITAMIN WITH MINERALS) TABS tablet Take 1 tablet by mouth daily.   Yes [provider]     Allergies:    No Known Allergies   Physical Exam:   Vitals  Blood pressure (!) 141/82, pulse 79, temperature 97.6 F (36.4 C), temperature source Oral, resp. rate (!) 27, SpO2 (!) 89 %.   1.  General: axoxo3  2. Psychiatric: euthymic  3. Neurologic: cn2-12 intact, reflexes 2+ symmetric, diffuse with no clonus, motor 5/5 in all 4ext  4. HEENMT:  Anicteric, pupils 1.26m symmetric, direct, consensual, near intact Neck: no jvd  5. Respiratory : + bibasilar crackles no wheezing  6. Cardiovascular : Midline scar, pacer scar left upper chest, rrr, s1, s2, no m/g/r  7. Gastrointestinal:  Abd: soft, nt, nd, +bs  8. Skin:  Ext: no c/c/e, no rash  9.Musculoskeletal:  Good ROM,  No adenoapthy      Data Review:    CBC Recent Labs  Lab 05/09/2019 0055  WBC 6.1  HGB 13.4  HCT 39.7  PLT 115*  MCV 89.2  MCH 30.1  MCHC 33.8  RDW 14.1  LYMPHSABS 0.3*  MONOABS 0.2  EOSABS 0.0  BASOSABS 0.0   ------------------------------------------------------------------------------------------------------------------  Results for orders placed or performed during the hospital encounter of 05/26/2019 (from the past 48 hour(s))  Comprehensive metabolic panel     Status: Abnormal   Collection Time: 05/26/2019 12:55 AM  Result Value Ref Range   Sodium 134 (L) 135 - 145 mmol/L   Potassium 4.1 3.5 - 5.1 mmol/L   Chloride 95 (L) 98 - 111 mmol/L   CO2 20 (L) 22 - 32 mmol/L   Glucose, Bld 264 (H) 70 - 99 mg/dL   BUN 69 (H) 8 - 23 mg/dL   Creatinine, Ser 2.34 (H) 0.61 - 1.24 mg/dL   Calcium 7.6 (L) 8.9 - 10.3 mg/dL   Total Protein 7.0 6.5 - 8.1 g/dL   Albumin 2.9 (L) 3.5 - 5.0 g/dL   AST 109 (H) 15 - 41 U/L   ALT 34 0 - 44 U/L   Alkaline Phosphatase 136 (H) 38 - 126 U/L   Total Bilirubin 0.8 0.3 - 1.2 mg/dL   GFR calc non Af Amer 28 (L) >60 mL/min   GFR calc Af Amer 33 (L) >60 mL/min   Anion gap 19 (H) 5 - 15    Comment: Performed at [facility] [address] [place] [address] Lactic acid, plasma     Status: Abnormal   Collection Time: 05/15/2019 12:55 AM  Result Value Ref Range   Lactic  Acid, Venous 2.3 (HH) 0.5 - 1.9 mmol/L    Comment:  CRITICAL RESULT CALLED TO, READ BACK BY AND VERIFIED WITH: HODGES AT 0158 ON 06/06/2019 BY JPM Performed at [facility] [address] [place], [place] [address]   CBC with Differential     Status: Abnormal   Collection Time: 05/23/2019 12:55 AM  Result Value Ref Range   WBC 6.1 4.0 - 10.5 K/uL   RBC 4.45 4.22 - 5.81 MIL/uL   Hemoglobin 13.4 13.0 - 17.0 g/dL   HCT 39.7 39.0 - 52.0 %   MCV 89.2 80.0 - 100.0 fL   MCH 30.1 26.0 - 34.0 pg   MCHC 33.8 30.0 - 36.0 g/dL   RDW 14.1 11.5 - 15.5 %   Platelets 115 (L) 150 - 400 K/uL    Comment: REPEATED TO VERIFY PLATELET COUNT CONFIRMED BY SMEAR SPECIMEN CHECKED FOR CLOTS Immature Platelet Fraction may be clinically indicated, consider ordering this additional test [id number]    nRBC 0.0 0.0 - 0.2 %   Neutrophils Relative % 91 %   Neutro Abs 5.6 1.7 - 7.7 K/uL   Lymphocytes Relative 5 %   Lymphs Abs 0.3 (L) 0.7 - 4.0 K/uL   Monocytes Relative 3 %   Monocytes Absolute 0.2 0.1 - 1.0 K/uL   Eosinophils Relative 0 %   Eosinophils Absolute 0.0 0.0 - 0.5 K/uL   Basophils Relative 0 %   Basophils Absolute 0.0 0.0 - 0.1 K/uL   Immature Granulocytes 1 %   Abs Immature Granulocytes 0.03 0.00 - 0.07 K/uL   Tear Drop Cells PRESENT     Comment: Performed at [facility], [place] [address], [place], [place] [address]  Protime-INR     Status: None   Collection Time: 05/17/2019 12:55 AM  Result Value Ref Range   Prothrombin Time 12.8 11.4 - 15.2 seconds   INR 1.0 0.8 - 1.2    Comment: (NOTE) INR goal varies based on device and disease states. Performed at [facility], [place] [address] [place], [place] [address]   Culture, blood (Routine x 2)     Status: None (Preliminary result)   Collection Time: 05/17/2019 12:59 AM   Specimen: BLOOD RIGHT FOREARM  Result Value Ref Range   Specimen Description      BLOOD RIGHT FOREARM Performed at [facility], [place] [address] [place], [place]  [address]    Special Requests      BOTTLES DRAWN AEROBIC AND ANAEROBIC Blood Culture adequate volume Performed at [place] [address] [place], [place] [address]    Culture PENDING    Report Status PENDING   Lactic acid, plasma     Status: None   Collection Time: 05/09/2019  3:11 AM  Result Value Ref Range   Lactic Acid, Venous 1.2 0.5 - 1.9 mmol/L    Comment: Performed at [facility], [place] [address] [place], [place] [address]    Chemistries  Recent Labs  Lab 05/09/2019 0055  NA 134*  K 4.1  CL 95*  CO2 20*  GLUCOSE 264*  BUN 69*  CREATININE 2.34*  CALCIUM 7.6*  AST 109*  ALT 34  ALKPHOS 136*  BILITOT 0.8   ------------------------------------------------------------------------------------------------------------------  ------------------------------------------------------------------------------------------------------------------ GFR: CrCl cannot be calculated (Unknown ideal weight.). Liver Function Tests: Recent Labs  Lab 05/27/2019 0055  AST 109*  ALT 34  ALKPHOS 136*  BILITOT 0.8  PROT 7.0  ALBUMIN 2.9*   No  results for input(s): LIPASE, AMYLASE in the last 168 hours. No results for input(s): AMMONIA in the last 168 hours. Coagulation Profile: Recent Labs  Lab 05/21/2019 0055  INR 1.0   Cardiac Enzymes: No results for input(s): CKTOTAL, CKMB, CKMBINDEX, TROPONINI in the last 168 hours. BNP (last 3 results) No results for input(s): PROBNP in the last 8760 hours. HbA1C: No results for input(s): HGBA1C in the last 72 hours. CBG: No results for input(s): GLUCAP in the last 168 hours. Lipid Profile: No results for input(s): CHOL, HDL, LDLCALC, TRIG, CHOLHDL, LDLDIRECT in the last 72 hours. Thyroid Function Tests: No results for input(s): TSH, T4TOTAL, FREET4, T3FREE, THYROIDAB in the last 72 hours. Anemia Panel: No results for input(s): VITAMINB12, FOLATE, FERRITIN, TIBC, IRON, RETICCTPCT in the last 72 hours.   --------------------------------------------------------------------------------------------------------------- Urine analysis: No results found for: COLORURINE, APPEARANCEUR, LABSPEC, PHURINE, GLUCOSEU, HGBUR, BILIRUBINUR, KETONESUR, PROTEINUR, UROBILINOGEN, NITRITE, LEUKOCYTESUR    Imaging Results:    Dg Chest Portable 1 View  Result Date: 05/11/2019 CLINICAL DATA:  Upper back pain. COVID-19 positive with test 1 week ago. EXAM: PORTABLE CHEST 1 VIEW COMPARISON:  None. FINDINGS: Heart size is exaggerated by low lung volumes. Diffuse interstitial and airspace disease is present. Disease is worse on the left upper lobe and right lower lobe. 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 The patient is status post median sternotomy. IMPRESSION: 1. Low lung volumes with diffuse interstitial and airspace disease bilaterally concerning for pneumonia. Electronically Signed   By: San Morelle M.D.   On: 05/28/2019 02:41          Assessment & Plan:    Principal Problem:   COVID-19 virus infection Active Problems:   CAP (community acquired pneumonia)   Essential hypertension   Hyperlipidemia   Diabetes ([place])   Acute renal failure (ARF) ([facility])   Protein-calorie malnutrition, severe ([facility])   Acute Respiratory Failure with hypoxia, secondary to CAP/ Covid -19 infection CAP Blood culture x2 Urine strep antigen Urine legionella antigen Rocephin iv, zithromax iv  Covid -19 + Check esr, crp, cpk, d dimer, IL-6, ferritin, trop I q2h x2, 12 lead ekg If D dimer is positive then please order VQ scan and also start full dose lovenox No Remdesivir due to renal function Dexamethasone 83m iv qday Check cbc, cmp qam  ARF Check renal ultrasound Hydrate with ns iv Check cmp in am  CAD s/p CABG, pacer Cont Aspirin Cont Metoprolol 559mpo qday Cont Lipitor 4055mo qhs  Dm2 glucomander due to low Hco3  DVT Prophylaxis-   Lovenox - SCDs   AM Labs Ordered, also please review Full Orders  Family  Communication: Admission, patients condition and plan of care including tests being ordered have been discussed with the patient  who indicate understanding and agree with the plan and Code Status.  Code Status:  FULL CODE,  Spoke with his daughter and let her know [facility] /[facility]: Based on patients clinical presentation and evaluation of above clinical data, I have made determination that patient meets Inpatient criteria at this time.  Pt will require iv abx for cap as well as iv dexamethasone 6mg46m qday Pt is critically ill, and will require inpatient admission   Time spent in minutes : 70    JameJani Gravel on 05/20/2019 at 5:07 AM

## 2019-06-02 NOTE — Progress Notes (Signed)
Remdesivir - Pharmacy Brief Note   O:  ALT: 34 CXR: diffuse interstitial and airspace disease bilaterally concerning for pneumonia. SpO2: 88% on Room air   A/P:  Remdesivir 200 mg once followed by 100 mg daily x 4 days.   Gretta Arab PharmD, BCPS Clinical pharmacist phone 7am- 5pm: [phone] 05/09/2019 8:21 AM

## 2019-06-02 NOTE — Progress Notes (Signed)
Brief progress note:   Patient admitted this morning by Dr. Maudie Mercury with hypoxia and bilateral CXR infiltrates due to covid-19 infection. ALT is normal and AKI is no longer contraindication to remdesivir. I believe the benefit of FDA-approved remdesivir therapy given as soon as possible outweighs the risks.  Will order loading dose now.   Vance Gather, MD 06/05/2019 7:48 AM

## 2019-06-02 NOTE — ED Notes (Signed)
Pt asked to provide urine specimen but pt unable to void at this time.

## 2019-06-02 NOTE — ED Notes (Signed)
MD Scher[place] notified that pt is barely maintaining O2 sats of 89% on NRB at 15 L.  Pt current O2 sats between 85-89% with labored breathing.  Will continue to monitor.

## 2019-06-02 NOTE — ED Provider Notes (Signed)
Emergency Department Provider Note   I have reviewed the triage vital signs and the nursing notes.   HISTORY  Chief Complaint Back Pain and COVID +   HPI Robert Mcdaniel is a 68 y.o. male recently diagnosed with coronavirus 8 days ago and is a progressively worsening dyspnea and myalgias and arthralgias with chills since that time.  No urinary symptoms.  He has been coughing.  No other sick contacts.  No other changes.  Language interpreter used.   No other associated or modifying symptoms.    Past Medical History:  Diagnosis Date  . CAD (coronary artery disease)   . Diabetes ([place])   . Hyperlipidemia   . Hypertension     Patient Active Problem List   Diagnosis Date Noted  . COVID-19 virus infection 05/22/2019  . CAP (community acquired pneumonia) 05/12/2019  . Essential hypertension 05/23/2019  . Hyperlipidemia 05/20/2019  . Diabetes ([place]) 05/14/2019  . Acute renal failure (ARF) ([place]) 05/19/2019  . Protein-calorie malnutrition, severe ([place]) 05/31/2019  . COVID-19 05/18/2019  . Acute respiratory failure with hypoxia ([place])   . AKI (acute kidney injury) [facility])     Past Surgical History:  Procedure Laterality Date  . CORONARY ARTERY BYPASS GRAFT    . PACEMAKER INSERTION        Allergies Patient has no known allergies.  Family History  Problem Relation Age of Onset  . Heart attack Father     Social History Social History   Tobacco Use  . Smoking status: Former Research scientist (life sciences)  . Smokeless tobacco: Never Used  Substance Use Topics  . Alcohol use: Never    Frequency: Never  . Drug use: Never    Review of Systems  All other systems negative except as documented in the HPI. All pertinent positives and negatives as reviewed in the HPI. ____________________________________________   PHYSICAL EXAM:  VITAL SIGNS: ED Triage Vitals  Enc Vitals Group     BP 05/17/2019 0025 138/60     Pulse Rate 05/17/2019 0025 78     Resp 06/05/2019 0025 (!) 25     Temp 05/13/2019  0025 97.6 F (36.4 C)     Temp Source 05/24/2019 0025 Oral     SpO2 05/15/2019 0025 (!) 64 %     Weight 05/09/2019 0801 160 lb (72.6 kg)     Height 05/20/2019 0801 5' (1.524 m)    Constitutional: Alert and oriented. Well appearing and in no acute distress. Eyes: Conjunctivae are normal. PERRL. EOMI. Head: Atraumatic. Nose: No congestion/rhinnorhea. Mouth/Throat: Mucous membranes are moist.  Oropharynx non-erythematous. Neck: No stridor.  No meningeal signs.   Cardiovascular: Normal rate, regular rhythm. Good peripheral circulation. Grossly normal heart sounds.   Respiratory: Tachypneic respiratory effort.  Hypoxic on room air to 65%, 88% on 8 L nasal cannula and 91% on 15 L nonrebreather no retractions. Lungs diffusely diminished but no focal findings. Gastrointestinal: Soft and nontender. No distention.  Musculoskeletal: No lower extremity tenderness nor edema. No gross deformities of extremities. Neurologic:  Normal speech and language. No gross focal neurologic deficits are appreciated.  Skin:  Skin is warm, dry and intact. No rash noted.   ____________________________________________   LABS (all labs ordered are listed, but only abnormal results are displayed)  Labs Reviewed  SARS CORONAVIRUS 2 (HOSPITAL ORDER, [place] [facility]) - Abnormal; Notable for the following components:      Result Value   SARS Coronavirus 2 POSITIVE (*)    All other components within normal  limits  COMPREHENSIVE METABOLIC PANEL - Abnormal; Notable for the following components:   Sodium 134 (*)    Chloride 95 (*)    CO2 20 (*)    Glucose, Bld 264 (*)    BUN 69 (*)    Creatinine, Ser 2.34 (*)    Calcium 7.6 (*)    Albumin 2.9 (*)    AST 109 (*)    Alkaline Phosphatase 136 (*)    GFR calc non Af Amer 28 (*)    GFR calc Af Amer 33 (*)    Anion gap 19 (*)    All other components within normal limits  LACTIC ACID, PLASMA - Abnormal; Notable for the following components:   Lactic  Acid, Venous 2.3 (*)    All other components within normal limits  CBC WITH DIFFERENTIAL/PLATELET - Abnormal; Notable for the following components:   Platelets 115 (*)    Lymphs Abs 0.3 (*)    All other components within normal limits  URINALYSIS, ROUTINE W REFLEX MICROSCOPIC - Abnormal; Notable for the following components:   APPearance CLOUDY (*)    Glucose, UA 50 (*)    Hgb urine dipstick SMALL (*)    Ketones, ur 5 (*)    Protein, ur 100 (*)    Bacteria, UA RARE (*)    All other components within normal limits  C-REACTIVE PROTEIN - Abnormal; Notable for the following components:   CRP 18.6 (*)    All other components within normal limits  FERRITIN - Abnormal; Notable for the following components:   Ferritin 2,645 (*)    All other components within normal limits  LACTATE DEHYDROGENASE - Abnormal; Notable for the following components:   LDH 1,112 (*)    All other components within normal limits  SEDIMENTATION RATE - Abnormal; Notable for the following components:   Sed Rate 94 (*)    All other components within normal limits  GLUCOSE, CAPILLARY - Abnormal; Notable for the following components:   Glucose-Capillary 150 (*)    All other components within normal limits  BASIC METABOLIC PANEL - Abnormal; Notable for the following components:   CO2 20 (*)    Glucose, Bld 175 (*)    BUN 68 (*)    Creatinine, Ser 2.00 (*)    Calcium 7.5 (*)    GFR calc non Af Amer 34 (*)    GFR calc Af Amer 39 (*)    Anion gap 17 (*)    All other components within normal limits  BASIC METABOLIC PANEL - Abnormal; Notable for the following components:   CO2 20 (*)    Glucose, Bld 187 (*)    BUN 59 (*)    Creatinine, Ser 1.56 (*)    Calcium 7.8 (*)    GFR calc non Af Amer 46 (*)    GFR calc Af Amer 53 (*)    All other components within normal limits  GLUCOSE, CAPILLARY - Abnormal; Notable for the following components:   Glucose-Capillary 195 (*)    All other components within normal limits   GLUCOSE, CAPILLARY - Abnormal; Notable for the following components:   Glucose-Capillary 168 (*)    All other components within normal limits  GLUCOSE, CAPILLARY - Abnormal; Notable for the following components:   Glucose-Capillary 157 (*)    All other components within normal limits  GLUCOSE, CAPILLARY - Abnormal; Notable for the following components:   Glucose-Capillary 187 (*)    All other components within normal limits  GLUCOSE, CAPILLARY -  Abnormal; Notable for the following components:   Glucose-Capillary 181 (*)    All other components within normal limits  CBG MONITORING, ED - Abnormal; Notable for the following components:   Glucose-Capillary 183 (*)    All other components within normal limits  CBG MONITORING, ED - Abnormal; Notable for the following components:   Glucose-Capillary 147 (*)    All other components within normal limits  CBG MONITORING, ED - Abnormal; Notable for the following components:   Glucose-Capillary 153 (*)    All other components within normal limits  TROPONIN I (HIGH SENSITIVITY) - Abnormal; Notable for the following components:   Troponin I (High Sensitivity) 123 (*)    All other components within normal limits  TROPONIN I (HIGH SENSITIVITY) - Abnormal; Notable for the following components:   Troponin I (High Sensitivity) 127 (*)    All other components within normal limits  CULTURE, BLOOD (ROUTINE X 2)  MRSA PCR SCREENING  CULTURE, BLOOD (ROUTINE X 2)  LACTIC ACID, PLASMA  PROTIME-INR  BRAIN NATRIURETIC PEPTIDE  PROCALCITONIN  STREP PNEUMONIAE URINARY ANTIGEN  HIV ANTIBODY (ROUTINE TESTING W REFLEX)  HEPATITIS B SURFACE ANTIGEN  INTERLEUKIN-6, PLASMA  LEGIONELLA PNEUMOPHILA SEROGP 1 UR AG  URINALYSIS, ROUTINE W REFLEX MICROSCOPIC  HEMOGLOBIN A1C  CBC WITH DIFFERENTIAL/PLATELET  COMPREHENSIVE METABOLIC PANEL  D-DIMER, QUANTITATIVE (NOT AT [facility])  C-REACTIVE PROTEIN  FERRITIN  PROCALCITONIN  ABO/RH  TYPE AND SCREEN  TYPE AND SCREEN   ABO/RH   ____________________________________________  EKG   EKG Interpretation  Date/Time:  Monday June 02 2019 00:49:28 EDT Ventricular Rate:  69 PR Interval:    QRS Duration: 151 QT Interval:  462 QTC Calculation: 495 R Axis:   76 Text Interpretation:  Sinus rhythm Probable left atrial enlargement Right bundle branch block Anteroseptal infarct, age indeterminate agree. no old comparison Confirmed by Charlesetta Shanks [phone]) on 05/23/2019 9:20:46 AM       ____________________________________________  RADIOLOGY  Dg Chest Portable 1 View  Result Date: 05/23/2019 CLINICAL DATA:  Upper back pain. COVID-19 positive with test 1 week ago. EXAM: PORTABLE CHEST 1 VIEW COMPARISON:  None. FINDINGS: Heart size is exaggerated by low lung volumes. Diffuse interstitial and airspace disease is present. Disease is worse on the left upper lobe and right lower lobe. 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 The patient is status post median sternotomy. IMPRESSION: 1. Low lung volumes with diffuse interstitial and airspace disease bilaterally concerning for pneumonia. Electronically Signed   By: San Morelle M.D.   On: 05/10/2019 02:41    ____________________________________________   PROCEDURES  Procedure(s) performed:   Procedures  CRITICAL CARE Performed by: Merrily Pew Total critical care time: 35 minutes Critical care time was exclusive of separately billable procedures and treating other patients. Critical care was necessary to treat or prevent imminent or life-threatening deterioration. Critical care was time spent personally by me on the following activities: development of treatment plan with patient and/or surrogate as well as nursing, discussions with consultants, evaluation of patient's response to treatment, examination of patient, obtaining history from patient or surrogate, ordering and performing treatments and interventions, ordering and review of laboratory studies, ordering  and review of radiographic studies, pulse oximetry and re-evaluation of patient's condition.  ____________________________________________   INITIAL IMPRESSION / ASSESSMENT AND PLAN / ED COURSE  Discussed with [facility] providers and if patient is satting above 88% on a nonrebreather and is not in distress they are not requiring intubation at this time.  Patient has been observed emergency  room for over 3 hours in this position.  Low sats have been 88-89 on 8 L nasal cannula but on 15 L she consistently stays above 90.  Will defer intubation at this time.  Discussed with hospitalist will admit for transfer to [facility]     Pertinent labs & imaging results that were available during my care of the patient were reviewed by me and considered in my medical decision making (see chart for details).  ____________________________________________  FINAL CLINICAL IMPRESSION(S) / ED DIAGNOSES  Final diagnoses:  Acute respiratory failure with hypoxia ([place])  COVID-19  AKI (acute kidney injury) ([place])     MEDICATIONS GIVEN DURING THIS VISIT:  Medications  aspirin EC tablet 81 mg (81 mg Oral Given 05/29/2019 1309)  atorvastatin (LIPITOR) tablet 40 mg (40 mg Oral Given 05/22/2019 1309)  metoprolol succinate (TOPROL-XL) 24 hr tablet 50 mg (50 mg Oral Given 05/16/2019 1310)  docusate sodium (COLACE) capsule 100 mg (100 mg Oral Given 05/26/2019 1309)  vitamin C (ASCORBIC ACID) tablet 1,000 mg (1,000 mg Oral Given 05/08/2019 1309)  multivitamin with minerals tablet 1 tablet (1 tablet Oral Given 05/18/2019 1309)  dextrose 5 %-0.45 % sodium chloride infusion ( Intravenous New Bag/Given 05/25/2019 2301)  insulin regular bolus via infusion 0-10 Units (0 Units Intravenous Not Given 05/20/2019 2135)  insulin regular, human (MYXREDLIN) 100 units/ 100 mL infusion (1.2 Units/hr Intravenous Rate/Dose Change 05/25/2019 2240)  dextrose 50 % solution 25 mL (has no administration in time range)  dexamethasone (DECADRON) injection 6  mg (6 mg Intravenous Given 05/14/2019 0825)  remdesivir 200 mg in sodium chloride 0.9 % 250 mL IVPB (200 mg Intravenous New Bag/Given 06/05/2019 1054)    Followed by  remdesivir 100 mg in sodium chloride 0.9 % 250 mL IVPB (has no administration in time range)  cholecalciferol (VITAMIN D3) tablet 1,000 Units (1,000 Units Oral Given 05/23/2019 1309)  heparin injection 7,500 Units (7,500 Units Subcutaneous Given 05/13/2019 2259)  famotidine (PEPCID) tablet 20 mg (20 mg Oral Given 05/25/2019 1744)  hydrALAZINE (APRESOLINE) injection 10 mg (has no administration in time range)  Chlorhexidine Gluconate Cloth 2 % PADS 6 each (has no administration in time range)  MEDLINE mouth rinse (15 mLs Mouth Rinse Given 06/06/2019 2136)  sodium chloride 0.9 % bolus 500 mL (0 mLs Intravenous Stopped 05/08/2019 0426)  tocilizumab (ACTEMRA) 8 mg/kg = 580 mg in sodium chloride 0.9 % 100 mL infusion ( Intravenous Stopped 05/28/2019 1639)     NEW OUTPATIENT MEDICATIONS STARTED DURING THIS VISIT:  Current Discharge Medication List      Note:  This note was prepared with assistance of Dragon voice recognition software. Occasional wrong-word or sound-a-like substitutions may have occurred due to the inherent limitations of voice recognition software.   Merrily Pew, MD 05/30/2019 [phone]

## 2019-06-02 NOTE — Progress Notes (Signed)
RN assisted patient to Facetime with family, daughter and grandkids.

## 2019-06-02 NOTE — ED Notes (Signed)
Pt stated he was unable to give urine sample

## 2019-06-02 NOTE — ED Notes (Signed)
XR at bedside

## 2019-06-02 NOTE — ED Triage Notes (Signed)
Patient here from home brought in by family with complaints of upper back pain radiating from shoulder to shoulder. COVID + on 7/20, and entire family.

## 2019-06-02 NOTE — Progress Notes (Signed)
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 All of family tested + for COVID 19.  Inpatient Diabetes Program Recommendations:     COVID 19 Glycemic Control Order Set. HgbA1C to determine pre-hospital glucose control  Will follow closely. Likely to be transferred to [place].  Thank you. Lorenda Peck, RD, LDN, CDE Inpatient Diabetes Coordinator [phone]

## 2019-06-02 NOTE — Progress Notes (Signed)
ANTICOAGULATION CONSULT NOTE - Initial Consult  Pharmacy Consult for Lovenox >> Heparin SQ Indication: VTE prophylaxis  No Known Allergies  Patient Measurements: Height: 5' (152.4 cm) Weight: 160 lb (72.6 kg) IBW/kg (Calculated) : 50  Vital Signs: Temp: 97.8 F (36.6 C) (07/27 1200) Temp Source: Oral (07/27 1200) BP: 175/75 (07/27 1215) Pulse Rate: 79 (07/27 1215)  Labs: Recent Labs    05/21/2019 0055 05/22/2019 0413  HGB 13.4  --   HCT 39.7  --   PLT 115*  --   LABPROT 12.8  --   INR 1.0  --   CREATININE 2.34*  --   TROPONINIHS  --  123*    Estimated Creatinine Clearance: 26.3 mL/min (A) (by C-G formula based on SCr of 2.34 mg/dL (H)).   Medical History: Past Medical History:  Diagnosis Date  . CAD (coronary artery disease)   . Diabetes ([place])   . Hyperlipidemia   . Hypertension     Medications:  Scheduled:  . aspirin EC  81 mg Oral Daily  . atorvastatin  40 mg Oral Daily  . cholecalciferol  1,000 Units Oral Daily  . dexamethasone (DECADRON) injection  6 mg Intravenous Daily  . docusate sodium  100 mg Oral Daily  . famotidine  20 mg Oral Daily  . heparin injection (subcutaneous)  7,500 Units Subcutaneous Q8H  . insulin regular  0-10 Units Intravenous TID WC  . metoprolol succinate  50 mg Oral Daily  . multivitamin with minerals  1 tablet Oral Daily  . vitamin C  1,000 mg Oral Daily   Infusions:  . dextrose 5 % and 0.45% NaCl 100 mL/hr at 05/23/2019 1311  . insulin 0.9 Units/hr (06/04/2019 1226)  . [START ON 06/03/2019] remdesivir 100 mg in NS 250 mL    . tocilizumab (ACTEMRA) IV      Assessment: 63 yoM admitted on 7/27 with COVID-19 pneumonia and AKI.  Pharamacy is consulted for VTE prophylaxis.   SCr 2.3 > 2 CBC: Hgb 13.4, Plt 115   Goal of Therapy:  Monitor platelets by anticoagulation protocol: Yes   Plan:  Heparin 7500 units SQ TID. Follow up renal function and change to Lovenox if improving.  Gretta Arab PharmD, BCPS Clinical pharmacist  phone 7am- 5pm: [phone] 05/09/2019 3:11 PM    Annalissa Murphey, Storm Frisk 05/13/2019,1:25 PM

## 2019-06-02 NOTE — ED Notes (Signed)
Pt placed on nonrebreather at 15L due to SpO2 between 85-89% while on 6L nasal cannula. Attempted to place on nonrebreather at 10 L with sats at 88% and sitting up in the bed. Currently at 91%. MD notified

## 2019-06-02 NOTE — ED Notes (Signed)
Notified [facility] of need for transport.

## 2019-06-02 NOTE — ED Notes (Signed)
This RN spoke with MD Auburn Bilberry regarding pts current respiratory status and O2 demands.  MD aware of pts current condition and status.  Will continue to monitor.

## 2019-06-02 NOTE — ED Notes (Signed)
Pt placed on 15 L nonrebreather due to SpO2 at 60% room air. Currently at 95%

## 2019-06-02 NOTE — ED Notes (Signed)
Report given to [facility].  [facility] reported ETA appx 30-40 min.

## 2019-06-02 NOTE — Progress Notes (Signed)
  Component     Latest Ref Rng & Units 05/07/2019         8:05 PM  Troponin I (High Sensitivity)     <18 ng/L [facility])    Critical troponin reported to Dr. Bridgett Larsson.

## 2019-06-02 NOTE — ED Notes (Signed)
Interpreter [phone] [place] used for communication with pt.

## 2019-06-02 NOTE — Progress Notes (Signed)
NAME:  Robert Mcdaniel, MRN:  [id number], DOB:  06/28/1951, LOS: 0 ADMISSION DATE:  05/09/2019, CONSULTATION DATE:  7/27 REFERRING MD:  Kim/Krishnan, CHIEF COMPLAINT:  Dyspnea   Brief History   68 y/o male admitted on 7/20 with ARDS from COVID 19 pneumonia.  History of present illness   This is a pleasant 68 year old male with a past medical history significant for coronary artery disease who came to [facility] on June 02, 2019 via transfer from the [place] long emergency room in the setting of severe acute respiratory failure with hypoxemia secondary to ARDS from COVID-19 pneumonia.  He says that he came to [place] approximately 10 days ago from [place].  He has been having symptoms essentially from the day he arrived.  He has had some fatigue, pain in his bones for most of the week.  However, on July 26 that night he started to feel much more short of breath.  He has had a dry cough, no mucus production.  He has had a little bit of nausea and vomiting in the last few days.  He was found to have severe acute respiratory failure with hypoxemia secondary to COVID-19 pneumonia and was transferred to our facility for further evaluation and management.   Past Medical History  Hypertension Hyperlipidemia DM2 CAD  Significant Hospital Events   7/27 admission to [facility]  Consults:  PCCM  Procedures:    Significant Diagnostic Tests:    Micro Data:  7/27 SARS-COV-2 > positive  Antimicrobials/OOVID Rx:  7/27 remdesivir>  7/27 decadron>  7/27 tocilizumab>   Interim history/subjective:  As above  Objective   Blood pressure (!) 175/75, pulse 79, temperature 97.8 F (36.6 C), temperature source Oral, resp. rate 18, height 5' (1.524 m), weight 72.6 kg, SpO2 93 %.    FiO2 (%):  [100 %] 100 %   Intake/Output Summary (Last 24 hours) at 05/24/2019 1322 Last data filed at 05/16/2019 1200 Gross per 24 hour  Intake 350 ml  Output 450 ml  Net -100 ml   Filed Weights   05/21/2019  0801  Weight: 72.6 kg    Examination:  General:  Resting comfortably in bed HENT: NCAT OP clear PULM: Crackles bases B, normal effort CV: RRR, no mgr GI: BS+, soft, nontender MSK: normal bulk and tone Neuro: awake, alert, no distress, MAEW   July 27 chest x-ray images independently reviewed showing low lung volumes, cardiomegaly, increased airspace disease bilaterally, dual-chamber pacemaker in place, sternal wires noted  Resolved Hospital Problem list     Assessment & Plan:  ARDS due to SARS-COV2 pneumonia Remdesivir 5 days Decadron 6mg  po daily 10 days Tocilizumab today Start heated high flow Monitor carefully in ICU as he is high risk for decompensation Tolerate periods of hypoxemia, goal at rest is greater than 85% SaO2, with movement ideally above 75% Decision for intubation should be based on a change in mental status or physical evidence of ventilatory failure such as nasal flaring, accessory muscle use, paradoxical breathing Out of bed to chair as able Incentive spirometry is important, use every hour Prone positioning while in bed  CAD Telemetry monitoring Continue home metoprolol  AKI: due to poor po intake, nausea/vomiting at home Gentle IVF Monitor BMET and UOP Replace electrolytes as needed   Best practice:  Diet: regular diet Pain/Anxiety/Delirium protocol (if indicated): n/a VAP protocol (if indicated): n/a DVT prophylaxis: sub q heparin per COVID protocol GI prophylaxis: n/a Glucose control: SSI, Glargine per protocol Mobility: bed rest Code  Status: full Family Communication: I updated his wife and daughter today by phone, questions answered Disposition: remain in ICU  Labs   CBC: Recent Labs  Lab 05/19/2019 0055  WBC 6.1  NEUTROABS 5.6  HGB 13.4  HCT 39.7  MCV 89.2  PLT 115*    Basic Metabolic Panel: Recent Labs  Lab 05/10/2019 0055  NA 134*  K 4.1  CL 95*  CO2 20*  GLUCOSE 264*  BUN 69*  CREATININE 2.34*  CALCIUM 7.6*    GFR: Estimated Creatinine Clearance: 26.3 mL/min (A) (by C-G formula based on SCr of 2.34 mg/dL (H)). Recent Labs  Lab 05/20/2019 0055 05/10/2019 0311 05/07/2019 0413  PROCALCITON  --   --  0.72  WBC 6.1  --   --   LATICACIDVEN 2.3* 1.2  --     Liver Function Tests: Recent Labs  Lab 05/25/2019 0055  AST 109*  ALT 34  ALKPHOS 136*  BILITOT 0.8  PROT 7.0  ALBUMIN 2.9*   No results for input(s): LIPASE, AMYLASE in the last 168 hours. No results for input(s): AMMONIA in the last 168 hours.  ABG No results found for: PHART, PCO2ART, PO2ART, HCO3, TCO2, ACIDBASEDEF, O2SAT   Coagulation Profile: Recent Labs  Lab 05/24/2019 0055  INR 1.0    Cardiac Enzymes: No results for input(s): CKTOTAL, CKMB, CKMBINDEX, TROPONINI in the last 168 hours.  HbA1C: No results found for: HGBA1C  CBG: Recent Labs  Lab 05/28/2019 0842 06/03/2019 1029 05/08/2019 1129 06/03/2019 1222  GLUCAP 183* 147* 153* 150*    Review of Systems:   Gen: Denies fever, chills, weight change, fatigue, night sweats HEENT: Denies blurred vision, double vision, hearing loss, tinnitus, sinus congestion, rhinorrhea, sore throat, neck stiffness, dysphagia PULM: per HPI CV: Denies chest pain, edema, orthopnea, paroxysmal nocturnal dyspnea, palpitations GI: per HPI GU: Denies dysuria, hematuria, polyuria, oliguria, urethral discharge Endocrine: Denies hot or cold intolerance, polyuria, polyphagia or appetite change Derm: Denies rash, dry skin, scaling or peeling skin change Heme: Denies easy bruising, bleeding, bleeding gums Neuro: Denies headache, numbness, weakness, slurred speech, loss of memory or consciousness   Past Medical History  He,  has a past medical history of CAD (coronary artery disease), Diabetes ([place]), Hyperlipidemia, and Hypertension.   Surgical History    Past Surgical History:  Procedure Laterality Date  . CORONARY ARTERY BYPASS GRAFT    . PACEMAKER INSERTION       Social History   reports  that he has quit smoking. He has never used smokeless tobacco. He reports that he does not drink alcohol or use drugs.   Family History   His family history includes Heart attack in his father.   Allergies No Known Allergies   Home Medications  Prior to Admission medications   Medication Sig Start Date End Date Taking? Authorizing Provider  acetaminophen (TYLENOL) 500 MG tablet Take 1,000 mg by mouth every 6 (six) hours as needed for moderate pain or fever.   Yes [provider]  Ascorbic Acid (VITAMIN C) 1000 MG tablet Take 1,000 mg by mouth daily.   Yes [provider]  aspirin EC 81 MG tablet Take 81 mg by mouth daily.   Yes [provider]  atorvastatin (LIPITOR) 40 MG tablet Take 40 mg by mouth daily.   Yes [provider]  cholecalciferol (VITAMIN D3) 25 MCG (1000 UT) tablet Take 1,000 Units by mouth daily.   Yes [provider]  docusate sodium (COLACE) 100 MG capsule Take 100 mg  by mouth daily.   Yes [provider]  insulin glargine (LANTUS) 100 UNIT/ML injection Inject 43-45 Units into the skin daily as needed. High Sugar. Only take if >120.   Yes [provider]  metoprolol succinate (TOPROL-XL) 50 MG 24 hr tablet Take 50 mg by mouth daily. Take with or immediately following a meal.   Yes [provider]  Multiple Vitamin (MULTIVITAMIN WITH MINERALS) TABS tablet Take 1 tablet by mouth daily.   Yes [provider]     Critical care time: 40 minutes    Roselie Awkward, MD [place] [facility] Pager: [phone] Cell: [phone] If no response, call [phone]

## 2019-06-03 ENCOUNTER — Inpatient Hospital Stay (HOSPITAL_COMMUNITY): Payer: HRSA Program

## 2019-06-03 DIAGNOSIS — J9601 Acute respiratory failure with hypoxia: Secondary | ICD-10-CM

## 2019-06-03 DIAGNOSIS — E1110 Type 2 diabetes mellitus with ketoacidosis without coma: Secondary | ICD-10-CM

## 2019-06-03 DIAGNOSIS — J988 Other specified respiratory disorders: Secondary | ICD-10-CM | POA: Diagnosis not present

## 2019-06-03 DIAGNOSIS — U071 COVID-19: Secondary | ICD-10-CM | POA: Diagnosis not present

## 2019-06-03 DIAGNOSIS — I2510 Atherosclerotic heart disease of native coronary artery without angina pectoris: Secondary | ICD-10-CM

## 2019-06-03 DIAGNOSIS — N179 Acute kidney failure, unspecified: Secondary | ICD-10-CM | POA: Diagnosis not present

## 2019-06-03 DIAGNOSIS — E1165 Type 2 diabetes mellitus with hyperglycemia: Secondary | ICD-10-CM | POA: Diagnosis not present

## 2019-06-03 LAB — POCT I-STAT 7, (LYTES, BLD GAS, ICA,H+H)
Acid-base deficit: 18 mmol/L — ABNORMAL HIGH (ref 0.0–2.0)
Acid-base deficit: 1 mmol/L (ref 0.0–2.0)
Bicarbonate: 15.2 mmol/L — ABNORMAL LOW (ref 20.0–28.0)
Bicarbonate: 28.1 mmol/L — ABNORMAL HIGH (ref 20.0–28.0)
Calcium, Ion: 1.29 mmol/L (ref 1.15–1.40)
Calcium, Ion: 1.12 mmol/L — ABNORMAL LOW (ref 1.15–1.40)
HCT: 33 % — ABNORMAL LOW (ref 39.0–52.0)
HCT: 34 % — ABNORMAL LOW (ref 39.0–52.0)
Hemoglobin: 11.2 g/dL — ABNORMAL LOW (ref 13.0–17.0)
Hemoglobin: 11.6 g/dL — ABNORMAL LOW (ref 13.0–17.0)
O2 Saturation: 45 %
O2 Saturation: 98 %
Patient temperature: 97.7
Potassium: 3.5 mmol/L (ref 3.5–5.1)
Potassium: 4.7 mmol/L (ref 3.5–5.1)
Sodium: 137 mmol/L (ref 135–145)
Sodium: 138 mmol/L (ref 135–145)
TCO2: 17 mmol/L — ABNORMAL LOW (ref 22–32)
TCO2: 30 mmol/L (ref 22–32)
pCO2 arterial: 74.2 mmHg (ref 32.0–48.0)
pCO2 arterial: 70.4 mmHg (ref 32.0–48.0)
pH, Arterial: 6.919 — CL (ref 7.350–7.450)
pH, Arterial: 7.207 — ABNORMAL LOW (ref 7.350–7.450)
pO2, Arterial: 41 mmHg — ABNORMAL LOW (ref 83.0–108.0)
pO2, Arterial: 129 mmHg — ABNORMAL HIGH (ref 83.0–108.0)

## 2019-06-03 LAB — GLUCOSE, CAPILLARY
Glucose-Capillary: 142 mg/dL — ABNORMAL HIGH (ref 70–99)
Glucose-Capillary: 150 mg/dL — ABNORMAL HIGH (ref 70–99)
Glucose-Capillary: 153 mg/dL — ABNORMAL HIGH (ref 70–99)
Glucose-Capillary: 164 mg/dL — ABNORMAL HIGH (ref 70–99)
Glucose-Capillary: 193 mg/dL — ABNORMAL HIGH (ref 70–99)
Glucose-Capillary: 126 mg/dL — ABNORMAL HIGH (ref 70–99)
Glucose-Capillary: 217 mg/dL — ABNORMAL HIGH (ref 70–99)
Glucose-Capillary: 286 mg/dL — ABNORMAL HIGH (ref 70–99)
Glucose-Capillary: 432 mg/dL — ABNORMAL HIGH (ref 70–99)
Glucose-Capillary: 454 mg/dL — ABNORMAL HIGH (ref 70–99)
Glucose-Capillary: 398 mg/dL — ABNORMAL HIGH (ref 70–99)
Glucose-Capillary: 288 mg/dL — ABNORMAL HIGH (ref 70–99)
Glucose-Capillary: 224 mg/dL — ABNORMAL HIGH (ref 70–99)

## 2019-06-03 LAB — COMPREHENSIVE METABOLIC PANEL
ALT: 38 U/L (ref 0–44)
ALT: 176 U/L — ABNORMAL HIGH (ref 0–44)
AST: 123 U/L — ABNORMAL HIGH (ref 15–41)
AST: 249 U/L — ABNORMAL HIGH (ref 15–41)
Albumin: 2.9 g/dL — ABNORMAL LOW (ref 3.5–5.0)
Albumin: 2.3 g/dL — ABNORMAL LOW (ref 3.5–5.0)
Alkaline Phosphatase: 144 U/L — ABNORMAL HIGH (ref 38–126)
Alkaline Phosphatase: 145 U/L — ABNORMAL HIGH (ref 38–126)
Anion gap: 11 (ref 5–15)
Anion gap: 19 — ABNORMAL HIGH (ref 5–15)
BUN: 55 mg/dL — ABNORMAL HIGH (ref 8–23)
BUN: 57 mg/dL — ABNORMAL HIGH (ref 8–23)
CO2: 21 mmol/L — ABNORMAL LOW (ref 22–32)
CO2: 17 mmol/L — ABNORMAL LOW (ref 22–32)
Calcium: 7.6 mg/dL — ABNORMAL LOW (ref 8.9–10.3)
Calcium: 9.1 mg/dL (ref 8.9–10.3)
Chloride: 105 mmol/L (ref 98–111)
Chloride: 102 mmol/L (ref 98–111)
Creatinine, Ser: 1.49 mg/dL — ABNORMAL HIGH (ref 0.61–1.24)
Creatinine, Ser: 1.92 mg/dL — ABNORMAL HIGH (ref 0.61–1.24)
GFR calc Af Amer: 56 mL/min — ABNORMAL LOW (ref >60)
GFR calc Af Amer: 41 mL/min — ABNORMAL LOW (ref >60)
GFR calc non Af Amer: 49 mL/min — ABNORMAL LOW (ref >60)
GFR calc non Af Amer: 36 mL/min — ABNORMAL LOW (ref >60)
Glucose, Bld: 174 mg/dL — ABNORMAL HIGH (ref 70–99)
Glucose, Bld: 555 mg/dL (ref 70–99)
Potassium: 3.9 mmol/L (ref 3.5–5.1)
Potassium: 3.2 mmol/L — ABNORMAL LOW (ref 3.5–5.1)
Sodium: 137 mmol/L (ref 135–145)
Sodium: 138 mmol/L (ref 135–145)
Total Bilirubin: 0.7 mg/dL (ref 0.3–1.2)
Total Bilirubin: 0.7 mg/dL (ref 0.3–1.2)
Total Protein: 6.9 g/dL (ref 6.5–8.1)
Total Protein: 5.5 g/dL — ABNORMAL LOW (ref 6.5–8.1)

## 2019-06-03 LAB — CBC WITH DIFFERENTIAL/PLATELET
Abs Immature Granulocytes: 0.03 10*3/uL (ref 0.00–0.07)
Basophils Absolute: 0 10*3/uL (ref 0.0–0.1)
Basophils Relative: 0 %
Eosinophils Absolute: 0 10*3/uL (ref 0.0–0.5)
Eosinophils Relative: 0 %
HCT: 36.7 % — ABNORMAL LOW (ref 39.0–52.0)
Hemoglobin: 12.2 g/dL — ABNORMAL LOW (ref 13.0–17.0)
Immature Granulocytes: 1 %
Lymphocytes Relative: 8 %
Lymphs Abs: 0.5 10*3/uL — ABNORMAL LOW (ref 0.7–4.0)
MCH: 29.7 pg (ref 26.0–34.0)
MCHC: 33.2 g/dL (ref 30.0–36.0)
MCV: 89.3 fL (ref 80.0–100.0)
Monocytes Absolute: 0.3 10*3/uL (ref 0.1–1.0)
Monocytes Relative: 4 %
Neutro Abs: 5.5 10*3/uL (ref 1.7–7.7)
Neutrophils Relative %: 87 %
Platelets: 156 10*3/uL (ref 150–400)
RBC: 4.11 MIL/uL — ABNORMAL LOW (ref 4.22–5.81)
RDW: 14.3 % (ref 11.5–15.5)
WBC: 6.3 10*3/uL (ref 4.0–10.5)
nRBC: 0 % (ref 0.0–0.2)

## 2019-06-03 LAB — PROCALCITONIN: Procalcitonin: 0.4 ng/mL

## 2019-06-03 LAB — FERRITIN: Ferritin: 4314 ng/mL — ABNORMAL HIGH (ref 24–336)

## 2019-06-03 LAB — C-REACTIVE PROTEIN: CRP: 17.8 mg/dL — ABNORMAL HIGH (ref <1.0)

## 2019-06-03 LAB — BASIC METABOLIC PANEL
Anion gap: 10 (ref 5–15)
BUN: 62 mg/dL — ABNORMAL HIGH (ref 8–23)
CO2: 27 mmol/L (ref 22–32)
Calcium: 7.5 mg/dL — ABNORMAL LOW (ref 8.9–10.3)
Chloride: 103 mmol/L (ref 98–111)
Creatinine, Ser: 2.01 mg/dL — ABNORMAL HIGH (ref 0.61–1.24)
GFR calc Af Amer: 39 mL/min — ABNORMAL LOW (ref >60)
GFR calc non Af Amer: 34 mL/min — ABNORMAL LOW (ref >60)
Glucose, Bld: 390 mg/dL — ABNORMAL HIGH (ref 70–99)
Potassium: 4.7 mmol/L (ref 3.5–5.1)
Sodium: 140 mmol/L (ref 135–145)

## 2019-06-03 LAB — POCT I-STAT EG7
Acid-base deficit: 10 mmol/L — ABNORMAL HIGH (ref 0.0–2.0)
Bicarbonate: 21.8 mmol/L (ref 20.0–28.0)
Calcium, Ion: 1.2 mmol/L (ref 1.15–1.40)
HCT: 35 % — ABNORMAL LOW (ref 39.0–52.0)
Hemoglobin: 11.9 g/dL — ABNORMAL LOW (ref 13.0–17.0)
O2 Saturation: 80 %
Patient temperature: 96.1
Potassium: 4.2 mmol/L (ref 3.5–5.1)
Sodium: 137 mmol/L (ref 135–145)
TCO2: 24 mmol/L (ref 22–32)
pCO2, Ven: 75.9 mmHg (ref 44.0–60.0)
pH, Ven: 7.058 — CL (ref 7.250–7.430)
pO2, Ven: 59 mmHg — ABNORMAL HIGH (ref 32.0–45.0)

## 2019-06-03 LAB — CBC
HCT: 36.6 % — ABNORMAL LOW (ref 39.0–52.0)
Hemoglobin: 11.5 g/dL — ABNORMAL LOW (ref 13.0–17.0)
MCH: 30.3 pg (ref 26.0–34.0)
MCHC: 31.4 g/dL (ref 30.0–36.0)
MCV: 96.6 fL (ref 80.0–100.0)
Platelets: 121 10*3/uL — ABNORMAL LOW (ref 150–400)
RBC: 3.79 MIL/uL — ABNORMAL LOW (ref 4.22–5.81)
RDW: 14.6 % (ref 11.5–15.5)
WBC: 7.2 10*3/uL (ref 4.0–10.5)
nRBC: 0.4 % — ABNORMAL HIGH (ref 0.0–0.2)

## 2019-06-03 LAB — D-DIMER, QUANTITATIVE
D-Dimer, Quant: 0.83 ug/mL-FEU — ABNORMAL HIGH (ref 0.00–0.50)
D-Dimer, Quant: 5.59 ug/mL-FEU — ABNORMAL HIGH (ref 0.00–0.50)

## 2019-06-03 LAB — TROPONIN I (HIGH SENSITIVITY)
Troponin I (High Sensitivity): 97 ng/L — ABNORMAL HIGH (ref <18)
Troponin I (High Sensitivity): 102 ng/L (ref <18)

## 2019-06-03 LAB — LEGIONELLA PNEUMOPHILA SEROGP 1 UR AG: L. pneumophila Serogp 1 Ur Ag: NEGATIVE

## 2019-06-03 LAB — PROTIME-INR
INR: 1.2 (ref 0.8–1.2)
Prothrombin Time: 14.7 seconds (ref 11.4–15.2)

## 2019-06-03 LAB — HEMOGLOBIN A1C
Hgb A1c MFr Bld: 13.8 % — ABNORMAL HIGH (ref 4.8–5.6)
Mean Plasma Glucose: 349.36 mg/dL

## 2019-06-03 LAB — ECHOCARDIOGRAM COMPLETE
Height: 60 in
Weight: 2560 oz

## 2019-06-03 LAB — HIV ANTIBODY (ROUTINE TESTING W REFLEX): HIV Screen 4th Generation wRfx: NONREACTIVE

## 2019-06-03 LAB — LACTIC ACID, PLASMA
Lactic Acid, Venous: >11 mmol/L (ref 0.5–1.9)
Lactic Acid, Venous: 1.6 mmol/L (ref 0.5–1.9)

## 2019-06-03 LAB — APTT: aPTT: 74 seconds — ABNORMAL HIGH (ref 24–36)

## 2019-06-03 LAB — ABO/RH: ABO/RH(D): O POS

## 2019-06-03 LAB — PHOSPHORUS: Phosphorus: 8.3 mg/dL — ABNORMAL HIGH (ref 2.5–4.6)

## 2019-06-03 LAB — MAGNESIUM: Magnesium: 2.3 mg/dL (ref 1.7–2.4)

## 2019-06-03 LAB — HEPATITIS B SURFACE ANTIGEN: Hepatitis B Surface Ag: NEGATIVE

## 2019-06-03 MED ORDER — FENTANYL 2500MCG IN NS 250ML (10MCG/ML) PREMIX INFUSION
50.0000 ug/h | INTRAVENOUS | Status: DC
  Administered 2019-06-03 – 2019-06-07 (×2): 50 ug/h via INTRAVENOUS
  Filled 2019-06-03 (×2): qty 250

## 2019-06-03 MED ORDER — AMIODARONE HCL IN DEXTROSE 360-4.14 MG/200ML-% IV SOLN
30.0000 mg/h | INTRAVENOUS | Status: DC
  Administered 2019-06-03 – 2019-06-05 (×6): 30 mg/h via INTRAVENOUS
  Filled 2019-06-03 (×4): qty 200

## 2019-06-03 MED ORDER — INSULIN REGULAR(HUMAN) IN NACL 100-0.9 UT/100ML-% IV SOLN
INTRAVENOUS | Status: AC
  Administered 2019-06-03: 3.7 [IU]/h via INTRAVENOUS
  Administered 2019-06-04: 1.8 [IU]/h via INTRAVENOUS
  Administered 2019-06-04: 5.6 [IU]/h via INTRAVENOUS
  Filled 2019-06-03 (×3): qty 100

## 2019-06-03 MED ORDER — VECURONIUM BOLUS VIA INFUSION
0.0800 mg/kg | Freq: Once | INTRAVENOUS | Status: AC
  Administered 2019-06-03: 18:00:00 5.8 mg via INTRAVENOUS
  Filled 2019-06-03: qty 6

## 2019-06-03 MED ORDER — MILRINONE LACTATE IN DEXTROSE 20-5 MG/100ML-% IV SOLN
0.2500 ug/kg/min | INTRAVENOUS | Status: DC
  Filled 2019-06-03: qty 100

## 2019-06-03 MED ORDER — INSULIN ASPART 100 UNIT/ML ~~LOC~~ SOLN: 0.0000 [IU] | Freq: Every day | SUBCUTANEOUS | Status: DC

## 2019-06-03 MED ORDER — AMIODARONE HCL IN DEXTROSE 360-4.14 MG/200ML-% IV SOLN
60.0000 mg/h | INTRAVENOUS | Status: DC
  Filled 2019-06-03: qty 200

## 2019-06-03 MED ORDER — AMLODIPINE BESYLATE 5 MG PO TABS
5.0000 mg | ORAL_TABLET | Freq: Every day | ORAL | Status: DC
  Administered 2019-06-03 – 2019-06-04 (×2): 5 mg via ORAL
  Filled 2019-06-03 (×3): qty 1

## 2019-06-03 MED ORDER — SODIUM CHLORIDE 0.9 % IV BOLUS
500.0000 mL | Freq: Once | INTRAVENOUS | Status: AC
  Administered 2019-06-03: 500 mL via INTRAVENOUS

## 2019-06-03 MED ORDER — FAMOTIDINE IN NACL 20-0.9 MG/50ML-% IV SOLN
20.0000 mg | INTRAVENOUS | Status: DC
  Administered 2019-06-04 – 2019-06-08 (×5): 20 mg via INTRAVENOUS
  Filled 2019-06-03 (×5): qty 50

## 2019-06-03 MED ORDER — CHLORHEXIDINE GLUCONATE 0.12% ORAL RINSE (MEDLINE KIT)
15.0000 mL | Freq: Two times a day (BID) | OROMUCOSAL | Status: DC
  Administered 2019-06-03 – 2019-06-09 (×12): 15 mL via OROMUCOSAL

## 2019-06-03 MED ORDER — AMIODARONE LOAD VIA INFUSION
150.0000 mg | Freq: Once | INTRAVENOUS | Status: DC
  Filled 2019-06-03: qty 83.34

## 2019-06-03 MED ORDER — INSULIN ASPART 100 UNIT/ML ~~LOC~~ SOLN
0.0000 [IU] | Freq: Three times a day (TID) | SUBCUTANEOUS | Status: DC
  Administered 2019-06-03: 7 [IU] via SUBCUTANEOUS

## 2019-06-03 MED ORDER — SODIUM BICARBONATE 8.4 % IV SOLN
50.0000 meq | Freq: Once | INTRAVENOUS | Status: AC
  Administered 2019-06-03: 50 meq via INTRAVENOUS

## 2019-06-03 MED ORDER — MIDAZOLAM BOLUS VIA INFUSION
1.0000 mg | INTRAVENOUS | Status: DC | PRN
  Filled 2019-06-03: qty 2

## 2019-06-03 MED ORDER — HEPARIN (PORCINE) 25000 UT/250ML-% IV SOLN
800.0000 [IU]/h | INTRAVENOUS | Status: DC
  Administered 2019-06-03: 800 [IU]/h via INTRAVENOUS
  Filled 2019-06-03: qty 250

## 2019-06-03 MED ORDER — FENTANYL BOLUS VIA INFUSION
50.0000 ug | INTRAVENOUS | Status: DC | PRN
  Administered 2019-06-07 (×2): 50 ug via INTRAVENOUS
  Filled 2019-06-03: qty 50

## 2019-06-03 MED ORDER — ARTIFICIAL TEARS OPHTHALMIC OINT
1.0000 "application " | TOPICAL_OINTMENT | Freq: Three times a day (TID) | OPHTHALMIC | Status: DC
  Administered 2019-06-03 – 2019-06-04 (×3): 1 via OPHTHALMIC
  Administered 2019-06-04: 14:00:00 via OPHTHALMIC
  Administered 2019-06-04 – 2019-06-07 (×8): 1 via OPHTHALMIC
  Filled 2019-06-03 (×2): qty 3.5

## 2019-06-03 MED ORDER — METHYLPREDNISOLONE SODIUM SUCC 40 MG IJ SOLR
40.0000 mg | Freq: Two times a day (BID) | INTRAMUSCULAR | Status: DC
  Administered 2019-06-03 – 2019-06-09 (×12): 40 mg via INTRAVENOUS
  Filled 2019-06-03 (×12): qty 1

## 2019-06-03 MED ORDER — ENOXAPARIN SODIUM 40 MG/0.4ML ~~LOC~~ SOLN
40.0000 mg | Freq: Two times a day (BID) | SUBCUTANEOUS | Status: DC
  Administered 2019-06-03: 13:00:00 40 mg via SUBCUTANEOUS
  Filled 2019-06-03: qty 0.4

## 2019-06-03 MED ORDER — INSULIN GLARGINE 100 UNIT/ML ~~LOC~~ SOLN
10.0000 [IU] | Freq: Every day | SUBCUTANEOUS | Status: DC
  Administered 2019-06-03: 11:00:00 10 [IU] via SUBCUTANEOUS
  Filled 2019-06-03: qty 0.1

## 2019-06-03 MED ORDER — NOREPINEPHRINE 16 MG/250ML-% IV SOLN
0.0000 ug/min | INTRAVENOUS | Status: DC
  Administered 2019-06-03: 2 ug/min via INTRAVENOUS
  Administered 2019-06-04: 2.027 ug/min via INTRAVENOUS
  Administered 2019-06-06: 2 ug/min via INTRAVENOUS
  Administered 2019-06-08: 21:00:00 10 ug/min via INTRAVENOUS
  Administered 2019-06-09: 08:00:00 40 ug/min via INTRAVENOUS
  Filled 2019-06-03 (×4): qty 250

## 2019-06-03 MED ORDER — STERILE WATER FOR INJECTION IV SOLN
INTRAVENOUS | Status: DC
  Administered 2019-06-03: 17:00:00 via INTRAVENOUS
  Filled 2019-06-03: qty 850

## 2019-06-03 MED ORDER — VECURONIUM BROMIDE 10 MG IV SOLR
0.0000 ug/kg/min | INTRAVENOUS | Status: DC
  Administered 2019-06-03: 1 ug/kg/min via INTRAVENOUS
  Administered 2019-06-04: 0.459 ug/kg/min via INTRAVENOUS
  Filled 2019-06-03 (×2): qty 100

## 2019-06-03 MED ORDER — SODIUM BICARBONATE 8.4 % IV SOLN
INTRAVENOUS | Status: DC
  Administered 2019-06-04: 02:00:00 via INTRAVENOUS
  Filled 2019-06-03 (×2): qty 150

## 2019-06-03 MED ORDER — MIDAZOLAM 50MG/50ML (1MG/ML) PREMIX INFUSION
2.0000 mg/h | INTRAVENOUS | Status: DC
  Administered 2019-06-03 – 2019-06-04 (×2): 2 mg/h via INTRAVENOUS
  Filled 2019-06-03 (×2): qty 50

## 2019-06-03 MED ORDER — SODIUM BICARBONATE 8.4 % IV SOLN
50.0000 meq | Freq: Once | INTRAVENOUS | Status: AC
  Administered 2019-06-03: 50 meq via INTRAVENOUS
  Filled 2019-06-03: qty 50

## 2019-06-03 MED ORDER — FENTANYL CITRATE (PF) 100 MCG/2ML IJ SOLN
50.0000 ug | Freq: Once | INTRAMUSCULAR | Status: AC
  Administered 2019-06-03: 50 ug via INTRAVENOUS
  Filled 2019-06-03: qty 2

## 2019-06-03 MED ORDER — ORAL CARE MOUTH RINSE
15.0000 mL | OROMUCOSAL | Status: DC
  Administered 2019-06-03 – 2019-06-09 (×57): 15 mL via OROMUCOSAL

## 2019-06-03 MED ORDER — DOBUTAMINE IN D5W 4-5 MG/ML-% IV SOLN
2.5000 ug/kg/min | INTRAVENOUS | Status: DC
  Filled 2019-06-03: qty 250

## 2019-06-03 MED ORDER — EPINEPHRINE PF 1 MG/ML IJ SOLN
0.5000 ug/min | INTRAVENOUS | Status: DC
  Administered 2019-06-03: 5 ug/min via INTRAVENOUS
  Filled 2019-06-03 (×2): qty 8

## 2019-06-03 MED ORDER — SODIUM CHLORIDE 0.9 % IV BOLUS
500.0000 mL | Freq: Once | INTRAVENOUS | Status: AC
  Administered 2019-06-03: 17:00:00 500 mL via INTRAVENOUS

## 2019-06-03 NOTE — Progress Notes (Signed)
Daughter called and RN updated her on patient's status and new IV medications.  All questions answered.

## 2019-06-03 NOTE — Procedures (Addendum)
Intubation Procedure Note Robert Mcdaniel [id number] 05/21/1951  Procedure: Intubation Indications: Respiratory insufficiency  Procedure Details Consent: Unable to obtain consent because of emergent medical necessity. Time Out: Verified patient identification, verified procedure, site/side was marked, verified correct patient position, special equipment/implants available, medications/allergies/relevent history reviewed, required imaging and test results available.  Performed  Drugs Etomidate 2m IV, versed 264mIV, fentanyl 5070mIV, Rocuronium 78m1m DL x 1 with MAC 4 blade Grade 1 view 7.5 ET tube passed through cords under direct visualization Placement confirmed with bilateral breath sounds, positive EtCO2 change and smoke in tube   Evaluation Hemodynamic Status: BP stable throughout; O2 sats: transiently fell during during procedure Patient's Current Condition: stable Complications: No apparent complications Patient did tolerate procedure well. Chest X-ray ordered to verify placement.  CXR: pending.   DougSimonne Maffucci8/2020

## 2019-06-03 NOTE — Progress Notes (Signed)
Patient's daughter called and was updated on care and patient's status.  Per daughter patient had called her complaining he had not had a shower, bed needed to be changed and he had not gotten food.  RN explained that lunch would come at noon and that patient had crackers and peanut butter already. Explained to daughter there is not a shower in ICU but that bathing pail could be set up for him a bath.  Explained that we do not have underwear other than mesh undies and that they would be offered to him. Also informed daughter that the interpretor had been used twice this morning to converse with patient regarding his needs and care and that patient had not asked for anything. Daughter thanked Therapist, sports and stated she would call her father back to talk with him about everything mentioned above.

## 2019-06-03 NOTE — Progress Notes (Signed)
[facility] Progress Note Patient Name: Robert Mcdaniel DOB: 03/06/1951 MRN: [id number]   Date of Service  06/03/2019  HPI/Events of Note  Qt PROLONGED AT 0.53  eICU Interventions  Pharmacy consult requested for [facility] review        Frederik Pear 06/03/2019, 8:48 PM

## 2019-06-03 NOTE — Progress Notes (Signed)
[facility]  Called to bedside emergently at 2:45 PM because of cardiac arrest.  The patient stood up from his bed and apparently removed his oxygen then collapsed.  Was found on floor in PEA arrest.  CPR initiated by nursing staff, bag mask ventilation administered. Moved to bed, intubated, had brief brady/PEA arrest again.  After intubation was very hypoxemic on full mechanical ventilatory support  CXR showed severe bilateral airspace disease, ETT in left mainstem, CVL in place  Bedside echo performed showing severely diminished LVEF   On exam: Vitals:   06/03/19 1300 06/03/19 1304 06/03/19 1400 06/03/19 1600  BP:  131/66 102/63 125/76  Pulse: 64 61 (!) 59 91  Resp: 17 (!) 22 18 (!) 21  Temp:  (!) 96.1 F (35.6 C)    TempSrc:  Axillary    SpO2: (!) 88% (!) 89% 90% (!) 75%  Weight:      Height:       General:  In bed on vent HENT: NCAT ETT in place PULM: CTA B, vent supported breathing CV: RRR, no mgr GI: BS+, soft, nontender MSK: normal bulk and tone Neuro: sedated on vent  Impression/plan:  Cardiac arrest due to severe hypoxemia/Cardiogenic shock:  Acute systolic heart failure -place CVL -monitor CVP -COOX -epineprhine infusion titrated for MAP > 65 -start dobutamine infusion at 2.74mcg/min IV, then check SvO2, if SvO2 < 60% increase again -stat bedside echo -correct hypoxemia/acidosis as able -12 lead -troponin  Mixed metabolic and respiratory acidosis -increase RR to 35 -start bicarbonate infusion -monitor repeat VBG -CMET -lactic acid  ARDS -ARDS vent settings Continue mechanical ventilation per ARDS protocol Target TVol 6-8cc/kgIBW Target Plateau Pressure < 30cm H20 Target driving pressure less than 15 cm of water Target PaO2 55-65: titrate PEEP/FiO2 per protocol As long as PaO2 to FiO2 ratio is less than 1:150 position in prone position for 16 hours a day Check CVP daily if CVL in place Target CVP less than 4, diurese as necessary Ventilator  associated pneumonia prevention protocol -neuromuscular blockade  COVID -remdesivir -steroids  Total CCM time in addition to procedures and this morning's work: 60 minutes  Roselie Awkward, MD [place] [facility] Pager: [phone] Cell: [phone] If no response, call [phone]

## 2019-06-03 NOTE — Progress Notes (Signed)
Inpatient Diabetes Program Recommendations  AACE/ADA: New Consensus Statement on Inpatient Glycemic Control (2015)  Target Ranges:  Prepandial:   less than 140 mg/dL      Peak postprandial:   less than 180 mg/dL (1-2 hours)      Critically ill patients:  140 - 180 mg/dL   Lab Results  Component Value Date   GLUCAP 217 (H) 06/03/2019   HGBA1C 13.8 (H) 06/03/2019    Review of Glycemic Control Results for TYBERIUS, RYNER (MRN [id number]) as of 06/03/2019 11:35  Ref. Range 06/03/2019 02:51 06/03/2019 03:49 06/03/2019 04:53 06/03/2019 06:53 06/03/2019 08:27  Glucose-Capillary Latest Ref Range: 70 - 99 mg/dL 150 (H) 153 (H) 164 (H) 193 (H) 217 (H)   Diabetes history: DM 2 Outpatient Diabetes medications: Lantus 43-45 units for high sugar (only if >120 mg/dL) Current orders for Inpatient glycemic control:  Lantus 10 units daily Novolog resistant tid with meals and HS Inpatient Diabetes Program Recommendations:    Note Lantus started this AM a few hours after insulin drip stopped.  Patient's insulin needs were low on insulin drip.  Agree with current orders.  May need increase of Lantus based on fasting CBG's.  Also may need addition of meal coverage.  A1C indicates very poor control of DM prior to admit with average blood sugars of 350 mg/dL.  When appropriate, will need further discussion with patient regarding home DM management and control.  Will follow.   Thanks  Adah Perl, RN, BC-ADM Inpatient Diabetes Coordinator Pager [phone] (8a-5p)

## 2019-06-03 NOTE — Procedures (Signed)
[facility]  Called emergently to bedside  Patient got out of bed, took off O2 and collapsed 2 rounds epi administered prior to my arrival, regained spontaneous circulation Placed in bed, lost pulse again around time of intubation CPR administered, 1 amp administered, regained spontaneous circulation at 2 min  Roselie Awkward, MD [place] [facility] Pager: [phone] Cell: [phone] If no response, call [phone]

## 2019-06-03 NOTE — Progress Notes (Signed)
[place] for Lovenox Indication: VTE prophylaxis  No Known Allergies  Patient Measurements: Height: 5' (152.4 cm) Weight: 160 lb (72.6 kg) IBW/kg (Calculated) : 50  Vital Signs: Temp: 96.2 F (35.7 C) (07/28 0800) Temp Source: Axillary (07/28 0800) BP: 171/72 (07/28 0800) Pulse Rate: 63 (07/28 0842)  Labs: Recent Labs    05/22/2019 0055 05/20/2019 0413 05/08/2019 1235 05/28/2019 2005 06/03/19 0550  HGB 13.4  --   --   --  12.2*  HCT 39.7  --   --   --  36.7*  PLT 115*  --   --   --  156  LABPROT 12.8  --   --   --   --   INR 1.0  --   --   --   --   CREATININE 2.34*  --  2.00* 1.56* 1.49*  TROPONINIHS  --  123*  --  127* 97*    Estimated Creatinine Clearance: 41.2 mL/min (A) (by C-G formula based on SCr of 1.49 mg/dL (H)).   Medical History: Past Medical History:  Diagnosis Date  . CAD (coronary artery disease)   . Diabetes ([place])   . Hyperlipidemia   . Hypertension     Medications:  Scheduled:  . aspirin EC  81 mg Oral Daily  . atorvastatin  40 mg Oral Daily  . Chlorhexidine Gluconate Cloth  6 each Topical Q0600  . cholecalciferol  1,000 Units Oral Daily  . dexamethasone (DECADRON) injection  6 mg Intravenous Daily  . docusate sodium  100 mg Oral Daily  . famotidine  20 mg Oral Daily  . insulin aspart  0-20 Units Subcutaneous TID WC  . insulin aspart  0-5 Units Subcutaneous QHS  . insulin glargine  10 Units Subcutaneous Daily  . mouth rinse  15 mL Mouth Rinse BID  . metoprolol succinate  50 mg Oral Daily  . multivitamin with minerals  1 tablet Oral Daily  . vitamin C  1,000 mg Oral Daily   Infusions:  . dextrose 5 % and 0.45% NaCl 50 mL/hr at 06/03/19 0933  . remdesivir 100 mg in NS 250 mL      Assessment: 36 yoM admitted on 7/27 with COVID-19 pneumonia and AKI.  Pharamacy is consulted for VTE prophylaxis.   Weight 72.6 kg, BMI 31 D-dimer 0.83 SCr 2.3 > 2 > 1.49 CBC: Hgb 12.2, Plt 156 No bleeding or  complications reported.  Goal of Therapy:  Monitor platelets by anticoagulation protocol: Yes   Plan:  Change to Lovenox 40 mg SQ BID Pharmacy will s/o notes, but will follow peripherally for renal dosing.   Gretta Arab PharmD, BCPS Clinical pharmacist phone 7am- 5pm: [phone] 06/03/2019 9:35 AM

## 2019-06-03 NOTE — Progress Notes (Signed)
ANTICOAGULATION CONSULT NOTE - Initial Consult  Pharmacy Consult for Heparin IV Indication: chest pain/ACS  No Known Allergies  Patient Measurements: Height: 5' (152.4 cm) Weight: 160 lb (72.6 kg) IBW/kg (Calculated) : 50 Heparin Dosing Weight: 65.5 kg  Vital Signs: Temp: 96.1 F (35.6 C) (07/28 1304) Temp Source: Axillary (07/28 1304) BP: 102/63 (07/28 1400) Pulse Rate: 59 (07/28 1400)  Labs: Recent Labs    05/20/2019 0055 05/23/2019 0413 05/15/2019 1235 05/30/2019 2005 06/03/19 0550 06/03/19 1553  HGB 13.4  --   --   --  12.2* 11.2*  HCT 39.7  --   --   --  36.7* 33.0*  PLT 115*  --   --   --  156  --   LABPROT 12.8  --   --   --   --   --   INR 1.0  --   --   --   --   --   CREATININE 2.34*  --  2.00* 1.56* 1.49*  --   TROPONINIHS  --  123*  --  127* 97*  --     Estimated Creatinine Clearance: 41.2 mL/min (A) (by C-G formula based on SCr of 1.49 mg/dL (H)).   Medical History: Past Medical History:  Diagnosis Date  . CAD (coronary artery disease)   . Diabetes ([place])   . Hyperlipidemia   . Hypertension     Medications:  Infusions:  . amiodarone     Followed by  . amiodarone    . dextrose 5 % and 0.45% NaCl 50 mL/hr at 06/03/19 1230  . epinephrine    . fentaNYL infusion INTRAVENOUS    . midazolam    . milrinone    . norepinephrine (LEVOPHED) Adult infusion    . remdesivir 100 mg in NS 250 mL Stopped (06/03/19 1125)  .  sodium bicarbonate  infusion 1000 mL    .  sodium bicarbonate (isotonic) infusion in sterile water    . vecuronium (NORCURON) infusion      Assessment: 60 yoM admitted on 7/27 with COVID-19 pneumonia.  PMH significant for CAD, DM, HLD, HTN, CABG, Pacemaker.  He was initially started on Heparin SQ for VTE prophylaxis, then transitioned to Lovenox prophylaxis as renal function improved.  Now, s/p cardiac arrest, pharmacy is consulted to dose Heparin for ACS. Last Lovenox dose 40mg  given 7/28 at ~1300  Today, 06/03/2019:  Baseline coags  pending  SCr 1.49  CBC:  Hgb 11.2, Plt 156  No bleeding or complications reported.  Goal of Therapy:  Heparin level 0.3-0.7 units/ml Monitor platelets by anticoagulation protocol: Yes   Plan:   No heparin bolus given proximity of Lovenox   Start heparin IV infusion at 800 units/hr  Heparin level 6 hours after starting  Daily heparin level and CBC  Continue to monitor H&H and platelets   Gretta Arab PharmD, BCPS Clinical pharmacist phone 7am- 5pm: [phone] 06/03/2019 4:26 PM

## 2019-06-03 NOTE — Progress Notes (Addendum)
PROGRESS NOTE  Ranen Doolin BZJ:[id number] DOB: March 03, 1951 DOA: 05/08/2019  PCP: Patient, No Pcp Per  Brief History/Interval Summary: 68 year old male who speaks only Spanish with a past medical history of coronary artery disease, diabetes mellitus type 2 on insulin, possible chronic kidney disease unspecified stage presented with complains of shortness of breath dry cough.  Patient had nausea vomiting a few days ago.  Patient was found to be positive for COVID-19.  Chest x-ray showed pneumonia.  Patient was hospitalized for further management.  He was hypoxic.    Reason for Visit: Acute respiratory disease due to COVID-19  Consultants: Pulmonology  Procedures: None  Antibiotics: Anti-infectives (From admission, onward)   Start     Dose/Rate Route Frequency Ordered Stop   06/03/19 1100  remdesivir 100 mg in sodium chloride 0.9 % 250 mL IVPB     100 mg 500 mL/hr over 30 Minutes Intravenous Every 24 hours 05/25/2019 0927 06/07/19 1059   06/06/2019 1100  remdesivir 200 mg in sodium chloride 0.9 % 250 mL IVPB     200 mg 500 mL/hr over 30 Minutes Intravenous Once 05/15/2019 0927 05/17/2019 1124   05/25/2019 0500  azithromycin (ZITHROMAX) 500 mg in sodium chloride 0.9 % 250 mL IVPB  Status:  Discontinued     500 mg 250 mL/hr over 60 Minutes Intravenous Daily 05/08/2019 0416 05/25/2019 1247   05/10/2019 0430  cefTRIAXone (ROCEPHIN) 2 g in sodium chloride 0.9 % 100 mL IVPB  Status:  Discontinued     2 g 200 mL/hr over 30 Minutes Intravenous Daily 05/08/2019 0416 05/24/2019 1247       Subjective/Interval History: Dr. Lake Bells was able to interpret.  Patient complains of some uneasy feeling in his abdomen but denies any pain.  He did have some nausea.  He states that he is hungry.  Patient unable to specify any further.  His shortness of breath is slightly better.  No other complaints offered.      Assessment/Plan:  Acute Hypoxic Resp. Failure due to Acute Covid 19 Viral Illness  COVID-19 Labs  Recent  Labs    05/15/2019 0413 05/29/2019 0416 06/03/19 0550  DDIMER  --   --  0.83*  FERRITIN  --  2,645* 4,314*  LDH 1,112*  --   --   CRP  --  18.6* 17.8*    Lab Results  Component Value Date   SARSCOV2NAA POSITIVE (A) 05/30/2019   SARSCOV2NAA Detected (A) 05/26/2019     Fever: Afebrile Oxygen requirements: Heated HF [place].  30 L/min.  100% FiO2.  Saturating in the late 80s to early 90s.   Antibacterials: Was given ceftriaxone and azithromycin in the ED. Not continued. Remdesivir: Day 2 today Steroids: Dexamethasone changed over to Solu-Medrol 40 mg every 12 hours Diuretics: Not on diuretics on a scheduled basis Actemra: 1 dose given on 7/27 Vitamin C and Zinc: Continue DVT Prophylaxis: Initially on heparin. Now on Lovenox 40 mg every 12 hours.  Research Studies: Not enrolled into any research studies yet.  Patient was noted to have extensive pneumonia on chest x-ray.  He remains on heated HF [place] at 30 L/min.  He is seems to be comfortable.  Noted to be slightly tachypneic.  From a COVID-19 standpoint he remains on Remdesivir steroids.  He was given 1 dose of Actemra yesterday.  He is on DVT prophylaxis.  His CRP is slightly better to 17.8.  D-dimer 0.83.  Ferritin 4314.  She remains normal.  Procalcitonin improved from 0.7 to 0.4.  He did get a dose of ceftriaxone and azithromycin in the emergency department yesterday.  Will not continue antibacterials. Lactic acid level was 2.3 initially.  Improved to 1.2.  Interpreter services utilized.  The treatment plan and use of medications and known side effects were discussed with patient.  Some of the medications used are based on case reports/anecdotal data which are not peer-reviewed and has not been studied using randomized control trials.  Complete risks and long-term side effects are unknown, however in the best clinical judgment they seem to be of some benefit.  Patient agrees with the treatment plan and want to receive these treatments as  indicated.  Patient was started on steroids as well as given Actemra.  Acute renal failure Baseline renal function is not known.  Patient could have CKD based on his history however this is unspecified.  Based on history there was concern for hypovolemia and prerenal azotemia.  He was given IV fluids with improvement in his renal function.  Monitor urine output.  Diet to be initiated today.  Hopefully will be able to take him off of IV fluids today.  Potassium 3.9.  History of diabetes mellitus type 2, uncontrolled with hyperglycemia/DKA Patient had elevated anion gap and was noted to have ketones in his urine.  Likely was in DKA as he did have nausea vomiting for a few days.  Patient was continued on IV insulin infusion.  His bicarbonate has improved.  His anion gap is closed.  Okay to transition him to long-acting insulin as well as SSI.  His HbA1c is 13.8 implying very poor control at baseline.  He is supposedly on Lantus at home.  However his compliance is questionable.  Abdominal discomfort Patient was unable to specify what was causing him to be uneasy in his abdomen.  His abdomen is benign to examination.  Nontender.  Blood work unremarkable as well.  Perhaps he is hungry.  He has not had any vomiting here although he has been nauseated.  We will give him a diet and continue to monitor closely.  History of coronary artery disease status post CABG EKG shows nonspecific T wave changes.  No old EKG available for comparison.  High-sensitivity troponin level was mildly elevated and has been trending down.  Could be suggestive of mild demand ischemia.  Since he does not have any chest pain will not pursue any inpatient work-up at this time.  Continue aspirin, beta-blocker and statin.   Essential hypertension Blood pressure is poorly controlled.  At home patient is on just a beta-blocker.  We will add amlodipine today.  Hydralazine as needed.  Transaminitis Mildly abnormal AST noted.  Alkaline  phosphatase is also mildly elevated.  Bilirubin is normal.  Most likely due to COVID-19.  Monitor periodically.  Obesity  Estimated body mass index is 31.25 kg/m as calculated from the following:   Height as of this encounter: 5' (1.524 m).   Weight as of this encounter: 72.6 kg.   ADDENDUM CODE BLUE was called overhead.  Apparently patient had gotten out of bed.  He had taken his oxygen off.  The nurse rushed towards him but he started falling.  Fortunately the nurse caught him.  And then he went into cardiac arrest.  CPR was initiated.  Patient was given epinephrine, bicarbonate, calcium chloride.  Critical care medicine was already at bedside.  He was intubated.  He had a episodes of arrhythmia including ventricular fibrillation as well as ventricular tachycardia.  Amiodarone was given.  He did not require defibrillation.  Patient was started on epinephrine infusion.  Patient was stabilized.  Stat blood work has been ordered.  Discussed with the patient's daughter as well as patient's son-in-law and patient's son who lives in [place].  (Patient actually lives in [place] and is here visiting his daughter, he came here about 2 weeks ago). They were apprised of the situation.  They would like to continue full CODE STATUS.  They were told that considering his previous history of heart disease and current acute illness that the chances of meaningful recovery is low if he again goes into cardiac arrest.  They understand.  DVT Prophylaxis: Lovenox PUD Prophylaxis: Famotidine Code Status: Full code Family Communication: Discussed with the patient Disposition Plan: Continue ICU monitoring for now   Medications:  Scheduled:  aspirin EC  81 mg Oral Daily   atorvastatin  40 mg Oral Daily   Chlorhexidine Gluconate Cloth  6 each Topical Q0600   cholecalciferol  1,000 Units Oral Daily   dexamethasone (DECADRON) injection  6 mg Intravenous Daily   docusate sodium  100 mg Oral Daily   enoxaparin  (LOVENOX) injection  40 mg Subcutaneous Q12H   famotidine  20 mg Oral Daily   insulin aspart  0-20 Units Subcutaneous TID WC   insulin aspart  0-5 Units Subcutaneous QHS   insulin glargine  10 Units Subcutaneous Daily   mouth rinse  15 mL Mouth Rinse BID   metoprolol succinate  50 mg Oral Daily   multivitamin with minerals  1 tablet Oral Daily   vitamin C  1,000 mg Oral Daily   Continuous:  dextrose 5 % and 0.45% NaCl 50 mL/hr at 06/03/19 2751   remdesivir 100 mg in NS 250 mL     [id number], hydrALAZINE   Objective:  Vital Signs  Vitals:   06/03/19 0700 06/03/19 0756 06/03/19 0800 06/03/19 0842  BP: (!) 156/67 (!) 171/72 (!) 171/72   Pulse: 61 66 75 63  Resp: (!) 21 (!) 25 (!) 23   Temp:   (!) 96.2 F (35.7 C)   TempSrc:   Axillary   SpO2: 94% 92% 100%   Weight:      Height:        Intake/Output Summary (Last 24 hours) at 06/03/2019 1031 Last data filed at 06/03/2019 0849 Gross per 24 hour  Intake 2016.89 ml  Output 1350 ml  Net 666.89 ml   Filed Weights   06/05/2019 0801  Weight: 72.6 kg    General appearance: Awake alert.  In no distress Resp: Tachypneic at rest.  Coarse breath sounds with crackles at the bases.  No wheezing or rhonchi. Cardio: S1-S2 is normal regular.  No S3-S4.  No rubs murmurs or bruit GI: Abdomen is soft.  Nontender nondistended.  Bowel sounds are present normal.  No masses organomegaly Extremities: No edema.  Full range of motion of lower extremities. Neurologic:   No focal neurological deficits.     Lab Results:  Data Reviewed: I have personally reviewed following labs and imaging studies  CBC: Recent Labs  Lab 05/10/2019 0055 06/03/19 0550  WBC 6.1 6.3  NEUTROABS 5.6 5.5  HGB 13.4 12.2*  HCT 39.7 36.7*  MCV 89.2 89.3  PLT 115* 759    Basic Metabolic Panel: Recent Labs  Lab 05/10/2019 0055 06/05/2019 1235 05/31/2019 2005 06/03/19 0550  NA 134* 137 139 137  K 4.1 4.2 4.3 3.9  CL 95* 100 104 105  CO2 20* 20* 20*  21*  GLUCOSE 264* 175* 187* 174*  BUN 69* 68* 59* 55*  CREATININE 2.34* 2.00* 1.56* 1.49*  CALCIUM 7.6* 7.5* 7.8* 7.6*    GFR: Estimated Creatinine Clearance: 41.2 mL/min (A) (by C-G formula based on SCr of 1.49 mg/dL (H)).  Liver Function Tests: Recent Labs  Lab 06/01/2019 0055 06/03/19 0550  AST 109* 123*  ALT 34 38  ALKPHOS 136* 144*  BILITOT 0.8 0.7  PROT 7.0 6.9  ALBUMIN 2.9* 2.9*     Coagulation Profile: Recent Labs  Lab 06/01/2019 0055  INR 1.0     CBG: Recent Labs  Lab 06/03/19 0251 06/03/19 0349 06/03/19 0453 06/03/19 0653 06/03/19 0827  GLUCAP 150* 153* 164* 193* 217*    Anemia Panel: Recent Labs    05/26/2019 0416 06/03/19 0550  FERRITIN 2,645* 4,314*    Recent Results (from the past 240 hour(s))  Novel Coronavirus, NAA ([facility])     Status: Abnormal   Collection Time: 05/26/19 12:00 AM  Result Value Ref Range Status   SARS-CoV-2, NAA Detected (A) Not Detected Final    Comment: This test was developed and its performance characteristics determined by [employer]. This test has not been FDA cleared or approved. This test has been authorized by FDA under an Emergency Use Authorization (EUA). This test is only authorized for the duration of time the declaration that circumstances exist justifying the authorization of the emergency use of in vitro diagnostic tests for detection of SARS-CoV-2 virus and/or diagnosis of COVID-19 infection under section 564(b)(1) of the Act, 21 U.S.C. 025KYH-0(W)(2), unless the authorization is terminated or revoked sooner. When diagnostic testing is negative, the possibility of a false negative result should be considered in the context of a patient's recent exposures and the presence of clinical signs and symptoms consistent with COVID-19. An individual without symptoms of COVID-19 and who is not shedding SARS-CoV-2 virus would expect to have a negative (not detected) result in this assay.   Culture,  blood (Routine x 2)     Status: None (Preliminary result)   Collection Time: 05/07/2019 12:55 AM   Specimen: BLOOD  Result Value Ref Range Status   Specimen Description   Final    BLOOD LEFT ANTECUBITAL Performed at [facility] [address], [place], [place] [address]    Special Requests   Final    BOTTLES DRAWN AEROBIC AND ANAEROBIC Blood Culture results may not be optimal due to an excessive volume of blood received in culture bottles Performed at [facility] [address], [place], [place] [address]    Culture   Final    NO GROWTH 1 DAY Performed at [facility], [place] [address], [place], [place] [address]    Report Status PENDING  Incomplete  Culture, blood (Routine x 2)     Status: None (Preliminary result)   Collection Time: 05/17/2019 12:59 AM   Specimen: BLOOD RIGHT FOREARM  Result Value Ref Range Status   Specimen Description   Final    BLOOD RIGHT FOREARM Performed at [facility], [place] [address], [place], [place] [address]    Special Requests   Final    BOTTLES DRAWN AEROBIC AND ANAEROBIC Blood Culture adequate volume Performed at [facility] [address], [place], [place] [address]    Culture   Final    NO GROWTH 1 DAY Performed at [facility], [place] [address] [place], [place] [address]    Report Status PENDING  Incomplete  SARS Coronavirus 2 (CEPHEID - Performed in [facility] hospital lab), Hosp Order     Status: Abnormal   Collection Time: 05/13/2019  3:52 AM   Specimen: Nasopharyngeal Swab  Result Value Ref Range Status   SARS Coronavirus 2 POSITIVE (A) NEGATIVE Final    Comment: RESULT CALLED TO, READ BACK BY AND VERIFIED WITH: S.BINGHAM RN AT 5397 ON 05/12/2019 BY S.VANHOORNE (NOTE) If result is NEGATIVE SARS-CoV-2 target nucleic acids are NOT DETECTED. The SARS-CoV-2 RNA is generally detectable in upper and lower  respiratory specimens during the acute phase of infection. The  lowest  concentration of SARS-CoV-2 viral copies this assay can detect is 250  copies / mL. A negative result does not preclude SARS-CoV-2 infection  and should not be used as the sole basis for treatment or other  patient management decisions.  A negative result may occur with  improper specimen collection / handling, submission of specimen other  than nasopharyngeal swab, presence of viral mutation(s) within the  areas targeted by this assay, and inadequate number of viral copies  (<250 copies / mL). A negative result must be combined with clinical  observations, patient history, and epidemiological information. If result is POSITIVE SARS-CoV-2 target nucleic acids are  DETECTED. The SARS-CoV-2 RNA is generally detectable in upper and lower  respiratory specimens during the acute phase of infection.  Positive  results are indicative of active infection with SARS-CoV-2.  Clinical  correlation with patient history and other diagnostic information is  necessary to determine patient infection status.  Positive results do  not rule out bacterial infection or co-infection with other viruses. If result is PRESUMPTIVE POSTIVE SARS-CoV-2 nucleic acids MAY BE PRESENT.   A presumptive positive result was obtained on the submitted specimen  and confirmed on repeat testing.  While 2019 novel coronavirus  (SARS-CoV-2) nucleic acids may be present in the submitted sample  additional confirmatory testing may be necessary for epidemiological  and / or clinical management purposes  to differentiate between  SARS-CoV-2 and other Sarbecovirus currently known to infect humans.  If clinically indicated additional testing with an alternate test  methodology  [phone]) is advised. The SARS-CoV-2 RNA is generally  detectable in upper and lower respiratory specimens during the acute  phase of infection. The expected result is Negative. Fact Sheet for Patients:   StrictlyIdeas.no Fact Sheet for Healthcare Providers: BankingDealers.co.za This test is not yet approved or cleared by the [place] FDA and has been authorized for detection and/or diagnosis of SARS-CoV-2 by FDA under an Emergency Use Authorization (EUA).  This EUA will remain in effect (meaning this test can be used) for the duration of the COVID-19 declaration under Section 564(b)(1) of the Act, 21 U.S.C. section 360bbb-3(b)(1), unless the authorization is terminated or revoked sooner. Performed at [facility], [place] [address] [place], [place] [address]   MRSA PCR Screening     Status: None   Collection Time: 05/22/2019  4:54 PM   Specimen: Nasal Mucosa; Nasopharyngeal  Result Value Ref Range Status   MRSA by PCR NEGATIVE NEGATIVE Final    Comment:        The GeneXpert MRSA Assay (FDA approved for NASAL specimens only), is one component of a comprehensive MRSA colonization surveillance program. It is not intended to diagnose MRSA infection nor to guide or monitor treatment for MRSA infections. Performed at [facility], [place] [address] [place], [place] [address]       Radiology Studies: Dg Chest Portable  1 View  Result Date: 05/14/2019 CLINICAL DATA:  Upper back pain. COVID-19 positive with test 1 week ago. EXAM: PORTABLE CHEST 1 VIEW COMPARISON:  None. FINDINGS: Heart size is exaggerated by low lung volumes. Diffuse interstitial and airspace disease is present. Disease is worse on the left upper lobe and right lower lobe. 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 The patient is status post median sternotomy. IMPRESSION: 1. Low lung volumes with diffuse interstitial and airspace disease bilaterally concerning for pneumonia. Electronically Signed   By: San Morelle M.D.   On: 05/29/2019 02:41       LOS: 1 day   [facility] Pager on [email or web address]  06/03/2019,  10:31 AM

## 2019-06-03 NOTE — Progress Notes (Signed)
Spoke with Dr. Lake Bells regarding blood pressure and epi, dobutamine drips. MD Gave order to decrease epi drip and to start levophed drip and titrate epi drip off.  MD gave order not to start dobutamine drip.

## 2019-06-03 NOTE — Progress Notes (Signed)
NAME:  Robert Mcdaniel, MRN:  [id number], DOB:  12-04-1950, LOS: 1 ADMISSION DATE:  05/09/2019, CONSULTATION DATE:  7/27 REFERRING MD:  Kim/Krishnan, CHIEF COMPLAINT:  Dyspnea   Brief History   68 y/o male admitted on 7/20 with ARDS from COVID 19 pneumonia.  Past Medical History  Hypertension Hyperlipidemia DM2 CAD  Significant Hospital Events   7/27 admission to [facility]  Consults:  PCCM  Procedures:    Significant Diagnostic Tests:    Micro Data:  7/27 SARS-COV-2 > positive  Antimicrobials/OOVID Rx:  7/27 remdesivir>  7/27 decadron>  7/27 tocilizumab>   Interim history/subjective:   Did not sleep well Wants to eat Blood sugar improved  Objective   Blood pressure 138/73, pulse 61, temperature 98 F (36.7 C), temperature source Axillary, resp. rate (!) 21, height 5' (1.524 m), weight 72.6 kg, SpO2 (!) 89 %.    FiO2 (%):  [100 %] 100 %   Intake/Output Summary (Last 24 hours) at 06/03/2019 0758 Last data filed at 06/03/2019 0400 Gross per 24 hour  Intake 1966.85 ml  Output 1100 ml  Net 866.85 ml   Filed Weights   05/31/2019 0801  Weight: 72.6 kg    Examination:  General:  Resting comfortably in chair HENT: NCAT OP clear PULM: Crackles bases B, normal effort CV: RRR, no mgr GI: BS+, soft, nontender MSK: normal bulk and tone Neuro: awake, alert, no distress, MAEW   July 27 chest x-ray images independently reviewed showing low lung volumes, cardiomegaly, increased airspace disease bilaterally, dual-chamber pacemaker in place, sternal wires noted  Resolved Hospital Problem list     Assessment & Plan:  ARDS due to SARS-COV2 pneumonia remdesivir 5 days Decadron 6mg  po daily 10 days S/p tocilizumab Continue heated high flow Tolerate periods of hypoxemia, goal at rest is greater than 85% SaO2, with movement ideally above 75% Decision for intubation should be based on a change in mental status or physical evidence of ventilatory failure such as nasal flaring,  accessory muscle use, paradoxical breathing Out of bed to chair as able Incentive spirometry is important, use every hour Prone positioning while in bed  CAD Tele Continue home metoprolol  AKI: improved with IVF Encourage po intake Monitor BMET and UOP Replace electrolytes as needed  Hyperglycemia/DKA? Improved Per TRH   Best practice:  Diet: regular diet Pain/Anxiety/Delirium protocol (if indicated): n/a VAP protocol (if indicated): n/a DVT prophylaxis: sub q heparin per COVID protocol GI prophylaxis: n/a Glucose control: SSI, Glargine per protocol Mobility: bed rest Code Status: full Family Communication: I updated his daughter Corinna Capra today by phone Disposition: remain in ICU  Labs   CBC: Recent Labs  Lab 06/03/2019 0055 06/03/19 0550  WBC 6.1 6.3  NEUTROABS 5.6 5.5  HGB 13.4 12.2*  HCT 39.7 36.7*  MCV 89.2 89.3  PLT 115* 235    Basic Metabolic Panel: Recent Labs  Lab 05/12/2019 0055 05/19/2019 1235 05/20/2019 2005 06/03/19 0550  NA 134* 137 139 137  K 4.1 4.2 4.3 3.9  CL 95* 100 104 105  CO2 20* 20* 20* 21*  GLUCOSE 264* 175* 187* 174*  BUN 69* 68* 59* 55*  CREATININE 2.34* 2.00* 1.56* 1.49*  CALCIUM 7.6* 7.5* 7.8* 7.6*   GFR: Estimated Creatinine Clearance: 41.2 mL/min (A) (by C-G formula based on SCr of 1.49 mg/dL (H)). Recent Labs  Lab 05/18/2019 0055 06/04/2019 0311 06/06/2019 0413 06/03/19 0550  PROCALCITON  --   --  0.72 0.40  WBC 6.1  --   --  6.3  LATICACIDVEN 2.3* 1.2  --   --     Liver Function Tests: Recent Labs  Lab 05/28/2019 0055 06/03/19 0550  AST 109* 123*  ALT 34 38  ALKPHOS 136* 144*  BILITOT 0.8 0.7  PROT 7.0 6.9  ALBUMIN 2.9* 2.9*   No results for input(s): LIPASE, AMYLASE in the last 168 hours. No results for input(s): AMMONIA in the last 168 hours.  ABG No results found for: PHART, PCO2ART, PO2ART, HCO3, TCO2, ACIDBASEDEF, O2SAT   Coagulation Profile: Recent Labs  Lab 05/08/2019 0055  INR 1.0    Cardiac Enzymes:  No results for input(s): CKTOTAL, CKMB, CKMBINDEX, TROPONINI in the last 168 hours.  HbA1C: No results found for: HGBA1C  CBG: Recent Labs  Lab 06/03/19 0149 06/03/19 0251 06/03/19 0349 06/03/19 0453 06/03/19 0653  GLUCAP 142* 150* 153* 164* 193*     Critical care time: 31 minutes   Roselie Awkward, MD [facility] Pager: [phone] Cell: [phone] If no response, call [phone]

## 2019-06-03 NOTE — Progress Notes (Signed)
At 1453 this RN went in room because bed alarm going off. Patient standing on left side of bed. He had pulled his high flow canula out of his nose and threw it in floor. Instructed patient to get back in bed and he kept walking towards end of bed on left side going towards sliding door. ECG leads came off due to patient continuing to walk around foot of bed. Patient then stumbled and RN assisted him to floor as he slid into sitting position. Patient did not hit his head.  Slight seizure like activity noted with agonal respirations not responsive.No pulse palpated, CPR started at 1455 while RN yelled for help. Other nurses along with RT came in room and Dr. Lake Bells was called to come to room. See code blue sheet. Patient intubated by Dr. Lake Bells.  Epi drip hung per MD.  Central line placed. Epi was held after bicarb drip pushes due to hypertension. RN at bedside continuously monitoring patient. MD aware that patient had assisted fall to the floor and did not hit his head. MD to call patient's family.

## 2019-06-03 NOTE — Procedures (Signed)
Central Venous Catheter Insertion Procedure Note Robert Mcdaniel [id number] 06-May-1951  Procedure: Insertion of Central Venous Catheter Indications: Assessment of intravascular volume  Procedure Details Consent: Risks of procedure as well as the alternatives and risks of each were explained to the (patient/caregiver).  Consent for procedure obtained. Time Out: Verified patient identification, verified procedure, site/side was marked, verified correct patient position, special equipment/implants available, medications/allergies/relevent history reviewed, required imaging and test results available.  Performed  Maximum sterile technique was used including antiseptics, cap, gloves, gown, hand hygiene, mask and sheet. Skin prep: Chlorhexidine; local anesthetic administered A antimicrobial bonded/coated triple lumen catheter was placed in the right internal jugular vein using the Seldinger technique.  Ultrasound was used to verify the patency of the vein and for real time needle guidance.  Evaluation Blood flow good Complications: No apparent complications Patient did tolerate procedure well. Chest X-ray ordered to verify placement.  CXR: pending.  [name] 06/03/2019, 3:49 PM

## 2019-06-04 ENCOUNTER — Inpatient Hospital Stay (HOSPITAL_COMMUNITY): Payer: HRSA Program

## 2019-06-04 DIAGNOSIS — I10 Essential (primary) hypertension: Secondary | ICD-10-CM | POA: Diagnosis not present

## 2019-06-04 DIAGNOSIS — Z9911 Dependence on respirator [ventilator] status: Secondary | ICD-10-CM

## 2019-06-04 DIAGNOSIS — J988 Other specified respiratory disorders: Secondary | ICD-10-CM

## 2019-06-04 DIAGNOSIS — N179 Acute kidney failure, unspecified: Secondary | ICD-10-CM | POA: Diagnosis not present

## 2019-06-04 DIAGNOSIS — U071 COVID-19: Secondary | ICD-10-CM | POA: Diagnosis not present

## 2019-06-04 DIAGNOSIS — J9601 Acute respiratory failure with hypoxia: Secondary | ICD-10-CM | POA: Diagnosis not present

## 2019-06-04 DIAGNOSIS — I5021 Acute systolic (congestive) heart failure: Secondary | ICD-10-CM

## 2019-06-04 DIAGNOSIS — E1165 Type 2 diabetes mellitus with hyperglycemia: Secondary | ICD-10-CM | POA: Diagnosis not present

## 2019-06-04 LAB — COMPREHENSIVE METABOLIC PANEL
ALT: 198 U/L — ABNORMAL HIGH (ref 0–44)
AST: 501 U/L — ABNORMAL HIGH (ref 15–41)
Albumin: 2.2 g/dL — ABNORMAL LOW (ref 3.5–5.0)
Alkaline Phosphatase: 181 U/L — ABNORMAL HIGH (ref 38–126)
Anion gap: 9 (ref 5–15)
BUN: 65 mg/dL — ABNORMAL HIGH (ref 8–23)
CO2: 29 mmol/L (ref 22–32)
Calcium: 6.9 mg/dL — ABNORMAL LOW (ref 8.9–10.3)
Chloride: 104 mmol/L (ref 98–111)
Creatinine, Ser: 2.42 mg/dL — ABNORMAL HIGH (ref 0.61–1.24)
GFR calc Af Amer: 31 mL/min — ABNORMAL LOW (ref >60)
GFR calc non Af Amer: 27 mL/min — ABNORMAL LOW (ref >60)
Glucose, Bld: 115 mg/dL — ABNORMAL HIGH (ref 70–99)
Potassium: 4.6 mmol/L (ref 3.5–5.1)
Sodium: 142 mmol/L (ref 135–145)
Total Bilirubin: 0.6 mg/dL (ref 0.3–1.2)
Total Protein: 5.2 g/dL — ABNORMAL LOW (ref 6.5–8.1)

## 2019-06-04 LAB — CBC WITH DIFFERENTIAL/PLATELET
Abs Immature Granulocytes: 0.07 10*3/uL (ref 0.00–0.07)
Basophils Absolute: 0 10*3/uL (ref 0.0–0.1)
Basophils Relative: 0 %
Eosinophils Absolute: 0 10*3/uL (ref 0.0–0.5)
Eosinophils Relative: 0 %
HCT: 33.1 % — ABNORMAL LOW (ref 39.0–52.0)
Hemoglobin: 10.6 g/dL — ABNORMAL LOW (ref 13.0–17.0)
Immature Granulocytes: 1 %
Lymphocytes Relative: 6 %
Lymphs Abs: 0.8 10*3/uL (ref 0.7–4.0)
MCH: 29.6 pg (ref 26.0–34.0)
MCHC: 32 g/dL (ref 30.0–36.0)
MCV: 92.5 fL (ref 80.0–100.0)
Monocytes Absolute: 0.6 10*3/uL (ref 0.1–1.0)
Monocytes Relative: 4 %
Neutro Abs: 11.7 10*3/uL — ABNORMAL HIGH (ref 1.7–7.7)
Neutrophils Relative %: 89 %
Platelets: 175 10*3/uL (ref 150–400)
RBC: 3.58 MIL/uL — ABNORMAL LOW (ref 4.22–5.81)
RDW: 14.7 % (ref 11.5–15.5)
WBC: 13.2 10*3/uL — ABNORMAL HIGH (ref 4.0–10.5)
nRBC: 0.2 % (ref 0.0–0.2)

## 2019-06-04 LAB — POCT I-STAT EG7
Acid-Base Excess: 3 mmol/L — ABNORMAL HIGH (ref 0.0–2.0)
Bicarbonate: 31.1 mmol/L — ABNORMAL HIGH (ref 20.0–28.0)
Calcium, Ion: 0.95 mmol/L — ABNORMAL LOW (ref 1.15–1.40)
HCT: 28 % — ABNORMAL LOW (ref 39.0–52.0)
Hemoglobin: 9.5 g/dL — ABNORMAL LOW (ref 13.0–17.0)
O2 Saturation: 42 %
Patient temperature: 97.7
Potassium: 3.6 mmol/L (ref 3.5–5.1)
Sodium: 139 mmol/L (ref 135–145)
TCO2: 33 mmol/L — ABNORMAL HIGH (ref 22–32)
pCO2, Ven: 63.2 mmHg — ABNORMAL HIGH (ref 44.0–60.0)
pH, Ven: 7.297 (ref 7.250–7.430)
pO2, Ven: 26 mmHg — CL (ref 32.0–45.0)

## 2019-06-04 LAB — POCT I-STAT 7, (LYTES, BLD GAS, ICA,H+H)
Acid-Base Excess: 3 mmol/L — ABNORMAL HIGH (ref 0.0–2.0)
Bicarbonate: 31.6 mmol/L — ABNORMAL HIGH (ref 20.0–28.0)
Calcium, Ion: 1.02 mmol/L — ABNORMAL LOW (ref 1.15–1.40)
HCT: 36 % — ABNORMAL LOW (ref 39.0–52.0)
Hemoglobin: 12.2 g/dL — ABNORMAL LOW (ref 13.0–17.0)
O2 Saturation: 96 %
Patient temperature: 97.5
Potassium: 4.5 mmol/L (ref 3.5–5.1)
Sodium: 141 mmol/L (ref 135–145)
TCO2: 34 mmol/L — ABNORMAL HIGH (ref 22–32)
pCO2 arterial: 68.8 mmHg (ref 32.0–48.0)
pH, Arterial: 7.267 — ABNORMAL LOW (ref 7.350–7.450)
pO2, Arterial: 98 mmHg (ref 83.0–108.0)

## 2019-06-04 LAB — FERRITIN: Ferritin: 7301 ng/mL — ABNORMAL HIGH (ref 24–336)

## 2019-06-04 LAB — GLUCOSE, CAPILLARY
Glucose-Capillary: 163 mg/dL — ABNORMAL HIGH (ref 70–99)
Glucose-Capillary: 184 mg/dL — ABNORMAL HIGH (ref 70–99)
Glucose-Capillary: 99 mg/dL (ref 70–99)
Glucose-Capillary: 84 mg/dL (ref 70–99)
Glucose-Capillary: 92 mg/dL (ref 70–99)
Glucose-Capillary: 111 mg/dL — ABNORMAL HIGH (ref 70–99)
Glucose-Capillary: 131 mg/dL — ABNORMAL HIGH (ref 70–99)
Glucose-Capillary: 186 mg/dL — ABNORMAL HIGH (ref 70–99)
Glucose-Capillary: 129 mg/dL — ABNORMAL HIGH (ref 70–99)
Glucose-Capillary: 238 mg/dL — ABNORMAL HIGH (ref 70–99)
Glucose-Capillary: 265 mg/dL — ABNORMAL HIGH (ref 70–99)
Glucose-Capillary: 52 mg/dL — ABNORMAL LOW (ref 70–99)
Glucose-Capillary: 222 mg/dL — ABNORMAL HIGH (ref 70–99)
Glucose-Capillary: 246 mg/dL — ABNORMAL HIGH (ref 70–99)
Glucose-Capillary: 98 mg/dL (ref 70–99)
Glucose-Capillary: 226 mg/dL — ABNORMAL HIGH (ref 70–99)
Glucose-Capillary: 126 mg/dL — ABNORMAL HIGH (ref 70–99)
Glucose-Capillary: 193 mg/dL — ABNORMAL HIGH (ref 70–99)

## 2019-06-04 LAB — PHOSPHORUS
Phosphorus: 6.5 mg/dL — ABNORMAL HIGH (ref 2.5–4.6)
Phosphorus: 6.3 mg/dL — ABNORMAL HIGH (ref 2.5–4.6)

## 2019-06-04 LAB — MAGNESIUM
Magnesium: 2.1 mg/dL (ref 1.7–2.4)
Magnesium: 2 mg/dL (ref 1.7–2.4)

## 2019-06-04 LAB — D-DIMER, QUANTITATIVE: D-Dimer, Quant: 4.65 ug/mL-FEU — ABNORMAL HIGH (ref 0.00–0.50)

## 2019-06-04 LAB — INTERLEUKIN-6, PLASMA: Interleukin-6, Plasma: 418.7 pg/mL — ABNORMAL HIGH (ref 0.0–12.2)

## 2019-06-04 LAB — C-REACTIVE PROTEIN: CRP: 8.8 mg/dL — ABNORMAL HIGH (ref <1.0)

## 2019-06-04 LAB — HEPARIN LEVEL (UNFRACTIONATED)
Heparin Unfractionated: 1.34 IU/mL — ABNORMAL HIGH (ref 0.30–0.70)
Heparin Unfractionated: 0.96 IU/mL — ABNORMAL HIGH (ref 0.30–0.70)

## 2019-06-04 LAB — PROCALCITONIN: Procalcitonin: 1.93 ng/mL

## 2019-06-04 MED ORDER — VITAL HIGH PROTEIN PO LIQD: 1000.0000 mL | ORAL | Status: DC

## 2019-06-04 MED ORDER — VECURONIUM BROMIDE 10 MG IV SOLR: 10.0000 mg | INTRAVENOUS | Status: DC | PRN

## 2019-06-04 MED ORDER — HEPARIN (PORCINE) 25000 UT/250ML-% IV SOLN
500.0000 [IU]/h | INTRAVENOUS | Status: DC
  Administered 2019-06-04 (×2): 500 [IU]/h via INTRAVENOUS

## 2019-06-04 MED ORDER — PRO-STAT SUGAR FREE PO LIQD: 30.0000 mL | Freq: Two times a day (BID) | ORAL | Status: DC

## 2019-06-04 MED ORDER — ASPIRIN 325 MG PO TABS
325.0000 mg | ORAL_TABLET | Freq: Every day | ORAL | Status: DC
  Administered 2019-06-04 – 2019-06-05 (×2): 325 mg
  Filled 2019-06-04 (×3): qty 1

## 2019-06-04 MED ORDER — SODIUM CHLORIDE 0.9 % IV BOLUS
500.0000 mL | Freq: Once | INTRAVENOUS | Status: AC
  Administered 2019-06-04: 500 mL via INTRAVENOUS

## 2019-06-04 MED ORDER — DEXTROSE 10 % IV SOLN: INTRAVENOUS | Status: DC | PRN

## 2019-06-04 MED ORDER — HEPARIN (PORCINE) 25000 UT/250ML-% IV SOLN
650.0000 [IU]/h | INTRAVENOUS | Status: DC
  Administered 2019-06-04: 650 [IU]/h via INTRAVENOUS

## 2019-06-04 MED ORDER — INSULIN DETEMIR 100 UNIT/ML ~~LOC~~ SOLN
10.0000 [IU] | Freq: Two times a day (BID) | SUBCUTANEOUS | Status: DC
  Administered 2019-06-04 – 2019-06-05 (×3): 10 [IU] via SUBCUTANEOUS
  Filled 2019-06-04 (×4): qty 0.1

## 2019-06-04 MED ORDER — DOBUTAMINE IN D5W 4-5 MG/ML-% IV SOLN
2.5000 ug/kg/min | INTRAVENOUS | Status: DC
  Administered 2019-06-04: 2.5 ug/kg/min via INTRAVENOUS
  Filled 2019-06-04: qty 250

## 2019-06-04 MED ORDER — INSULIN DETEMIR 100 UNIT/ML ~~LOC~~ SOLN: 22.0000 [IU] | Freq: Two times a day (BID) | SUBCUTANEOUS | Status: DC

## 2019-06-04 MED ORDER — INSULIN ASPART 100 UNIT/ML ~~LOC~~ SOLN
7.0000 [IU] | SUBCUTANEOUS | Status: DC
  Administered 2019-06-05 (×3): 7 [IU] via SUBCUTANEOUS

## 2019-06-04 MED ORDER — VITAL AF 1.2 CAL PO LIQD
1000.0000 mL | ORAL | Status: DC
  Administered 2019-06-05 – 2019-06-08 (×6): 1000 mL

## 2019-06-04 MED ORDER — INSULIN ASPART 100 UNIT/ML ~~LOC~~ SOLN
0.0000 [IU] | SUBCUTANEOUS | Status: DC
  Administered 2019-06-04: 3 [IU] via SUBCUTANEOUS
  Administered 2019-06-05: 12:00:00 11 [IU] via SUBCUTANEOUS
  Administered 2019-06-05: 01:00:00 5 [IU] via SUBCUTANEOUS
  Administered 2019-06-05: 11 [IU] via SUBCUTANEOUS
  Administered 2019-06-05: 05:00:00 5 [IU] via SUBCUTANEOUS

## 2019-06-04 NOTE — Progress Notes (Signed)
Inpatient Diabetes Program Recommendations  AACE/ADA: New Consensus Statement on Inpatient Glycemic Control (2015)  Target Ranges:  Prepandial:   less than 140 mg/dL      Peak postprandial:   less than 180 mg/dL (1-2 hours)      Critically ill patients:  140 - 180 mg/dL   Lab Results  Component Value Date   GLUCAP 129 (H) 06/04/2019   HGBA1C 13.8 (H) 06/03/2019    Review of Glycemic Control Results for Robert Mcdaniel, Robert Mcdaniel (MRN [id number]) as of 06/04/2019 09:45  Ref. Range 06/04/2019 04:31 06/04/2019 05:34 06/04/2019 06:37 06/04/2019 07:45 06/04/2019 08:53  Glucose-Capillary Latest Ref Range: 70 - 99 mg/dL 92 111 (H) 131 (H) 186 (H) 129 (H)   Diabetes history: DM 2 Outpatient Diabetes medications:  Lantus 43-45 units daily Current orders for Inpatient glycemic control:  IV insulin Inpatient Diabetes Program Recommendations:   Current insulin drip rates are 0 units/hr.  May consider transitioning pt. To SQ therapy.  Consider Levemir 8 units bid.  Also consider Novolog moderate correction q 4 hours.    Thanks,  Adah Perl, RN, BC-ADM Inpatient Diabetes Coordinator Pager [phone] (8a-5p)

## 2019-06-04 NOTE — Progress Notes (Signed)
Updated patient's daughter, answered all questions. She asks that if anything changes to please notify her.

## 2019-06-04 NOTE — Progress Notes (Signed)
ANTICOAGULATION CONSULT NOTE - Follow Up Consult  Pharmacy Consult for Heparin IV Indication: chest pain/ACS  No Known Allergies  Patient Measurements: Height: 5' (152.4 cm) Weight: 160 lb (72.6 kg) IBW/kg (Calculated) : 50 Heparin Dosing Weight: 65.5 kg  Vital Signs: Temp: 97.5 F (36.4 C) (07/29 0700) Temp Source: Oral (07/29 0700) BP: 99/66 (07/29 1011) Pulse Rate: 59 (07/29 0900)  Labs: Recent Labs    05/24/2019 0055  05/17/2019 2005 06/03/19 0550 06/03/19 1547  06/03/19 2025 06/03/19 2045 06/04/19 0030 06/04/19 0517 06/04/19 0530  HGB 13.4  --   --  12.2* 11.5*   < > 11.6*  --   --  12.2* 10.6*  HCT 39.7  --   --  36.7* 36.6*   < > 34.0*  --   --  36.0* 33.1*  PLT 115*  --   --  156 121*  --   --   --   --   --  175  APTT  --   --   --   --  74*  --   --   --   --   --   --   LABPROT 12.8  --   --   --  14.7  --   --   --   --   --   --   INR 1.0  --   --   --  1.2  --   --   --   --   --   --   HEPARINUNFRC  --   --   --   --   --   --   --   --  1.34*  --   --   CREATININE 2.34*   < > 1.56* 1.49* 1.92*  --   --  2.01*  --   --  2.42*  TROPONINIHS  --    < > 127* 97* 102*  --   --   --   --   --   --    < > = values in this interval not displayed.    Estimated Creatinine Clearance: 25.4 mL/min (A) (by C-G formula based on SCr of 2.42 mg/dL (H)).   Medications:  Infusions:  . amiodarone 30 mg/hr (06/04/19 0700)  . dextrose 5 % and 0.45% NaCl 75 mL/hr at 06/04/19 0410  . famotidine (PEPCID) IV 20 mg (06/04/19 1015)  . fentaNYL infusion INTRAVENOUS 60 mcg/hr (06/04/19 0700)  . heparin 650 Units/hr (06/04/19 0700)  . insulin 1.8 Units/hr (06/04/19 1010)  . midazolam 2 mg/hr (06/04/19 0700)  . norepinephrine (LEVOPHED) Adult infusion 2.027 mcg/min (06/04/19 0817)  . remdesivir 100 mg in NS 250 mL Stopped (06/03/19 1125)  .  sodium bicarbonate  infusion 1000 mL 100 mL/hr at 06/04/19 0700  . vecuronium (NORCURON) infusion 0.459 mcg/kg/min (06/04/19 0757)     Assessment: 60 yoM admitted on 7/27 with COVID-19 pneumonia.  PMH significant for CAD, DM, HLD, HTN, CABG, Pacemaker.  He was initially started on Heparin SQ for VTE prophylaxis, then transitioned to Lovenox prophylaxis as renal function improved.  Now, s/p cardiac arrest, pharmacy is consulted to dose Heparin for ACS. Last Lovenox dose 40mg  given 7/28 at ~1300  Today, 06/04/2019:  Heparin level 0.96  SCr up to 2.4  CBC:  Hgb decreased to 10.6, Plt up to 175.    No bleeding or complications reported.  Goal of Therapy:  Heparin level 0.3-0.7 units/ml Monitor platelets by anticoagulation protocol: Yes  Plan:  Decreased to heparin IV infusion at 500 units/hr Heparin level 8 hours after rate change Daily heparin level and CBC Continue to monitor H&H and platelets   Gretta Arab PharmD, BCPS Clinical pharmacist phone 7am- 5pm: [phone] 06/04/2019 10:18 AM

## 2019-06-04 NOTE — Progress Notes (Addendum)
ANTICOAGULATION CONSULT NOTE - Consult  Pharmacy Consult for Heparin IV Indication: chest pain/ACS  No Known Allergies  Patient Measurements: Height: 5' (152.4 cm) Weight: 160 lb (72.6 kg) IBW/kg (Calculated) : 50 Heparin Dosing Weight: 65.5 kg  Vital Signs: Temp: 97.6 F (36.4 C) (07/29 0000) Temp Source: Oral (07/29 0000) BP: 103/64 (07/29 0200) Pulse Rate: 60 (07/29 0200)  Labs: Recent Labs    05/18/2019 0055  05/24/2019 2005 06/03/19 0550 06/03/19 1547 06/03/19 1553 06/03/19 1703 06/03/19 2025 06/03/19 2045 06/04/19 0030  HGB 13.4  --   --  12.2* 11.5* 11.2* 11.9* 11.6*  --   --   HCT 39.7  --   --  36.7* 36.6* 33.0* 35.0* 34.0*  --   --   PLT 115*  --   --  156 121*  --   --   --   --   --   APTT  --   --   --   --  74*  --   --   --   --   --   LABPROT 12.8  --   --   --  14.7  --   --   --   --   --   INR 1.0  --   --   --  1.2  --   --   --   --   --   HEPARINUNFRC  --   --   --   --   --   --   --   --   --  1.34*  CREATININE 2.34*   < > 1.56* 1.49* 1.92*  --   --   --  2.01*  --   TROPONINIHS  --    < > 127* 97* 102*  --   --   --   --   --    < > = values in this interval not displayed.    Estimated Creatinine Clearance: 30.6 mL/min (A) (by C-G formula based on SCr of 2.01 mg/dL (H)).   Medical History: Past Medical History:  Diagnosis Date  . CAD (coronary artery disease)   . Diabetes ([place])   . Hyperlipidemia   . Hypertension     Medications:  Infusions:  . amiodarone 30 mg/hr (06/04/19 0200)  . dextrose 5 % and 0.45% NaCl Stopped (06/03/19 1301)  . DOBUTamine    . epinephrine Stopped (06/03/19 1627)  . famotidine (PEPCID) IV    . fentaNYL infusion INTRAVENOUS 50 mcg/hr (06/04/19 0200)  . heparin    . insulin 5.2 mL/hr at 06/04/19 0200  . midazolam 1 mg/hr (06/04/19 0200)  . norepinephrine (LEVOPHED) Adult infusion 3 mcg/min (06/04/19 0200)  . remdesivir 100 mg in NS 250 mL Stopped (06/03/19 1125)  .  sodium bicarbonate  infusion 1000 mL  100 mL/hr at 06/04/19 0200  . sodium chloride    . vecuronium (NORCURON) infusion 0.7 mcg/kg/min (06/04/19 0200)    Assessment: 55 yoM admitted on 7/27 with COVID-19 pneumonia.  PMH significant for CAD, DM, HLD, HTN, CABG, Pacemaker.  He was initially started on Heparin SQ for VTE prophylaxis, then transitioned to Lovenox prophylaxis as renal function improved.  Now, s/p cardiac arrest, pharmacy is consulted to dose Heparin for ACS. Last Lovenox dose 40mg  given 7/28 at ~1300  Today, 06/04/2019:  Initial heparin 1.34, supratherapeutic  SCr 2.01, increasing   CBC:  Pending with AM labs   No bleeding or complications reported.  Goal of Therapy:  Heparin  level 0.3-0.7 units/ml Monitor platelets by anticoagulation protocol: Yes   Plan:   Hold heparin infusion for one hour due to elevated HL  Re-start heparin after one hour at  650 units/hr  Heparin level 8 hours after re-starting  Daily heparin level and CBC  Continue to monitor H&H and platelets    Royetta Asal, PharmD, BCPS 06/04/2019 3:10 AM

## 2019-06-04 NOTE — Progress Notes (Signed)
Updated family with current status; all questions answered

## 2019-06-04 NOTE — Progress Notes (Addendum)
Initial Nutrition Assessment  DOCUMENTATION CODES:   Obesity unspecified  INTERVENTION:   Begin TF via OGT:   Vital AF 1.2 at 55 ml/h (1320 ml per day)   Provides 1584 kcal, 99 gm protein, 1071 ml free water daily  NUTRITION DIAGNOSIS:   Inadequate oral intake related to inability to eat as evidenced by NPO status.  GOAL:   Provide needs based on ASPEN/SCCM guidelines  MONITOR:   Vent status, TF tolerance, Labs  REASON FOR ASSESSMENT:   Ventilator, Consult Enteral/tube feeding initiation and management  ASSESSMENT:   68 yo male admitted with ARDS r/t COVID-19 PNA. PMH includes HTN, HLD, CAD, DM, pacemaker.   S/P PEA arrest 7/28, requiring intubation.  OGT in place. Received MD Consult for TF initiation and management.  Patient is currently intubated on ventilator support MV: 13.5 L/min Temp (24hrs), Avg:97.4 F (36.3 C), Min:96.6 F (35.9 C), Max:97.7 F (36.5 C)   Labs reviewed. BUN 65, creatinine 2.42 (H) CBG's: [phone]  Medications reviewed and include levophed, vitamin D3, colace, solu-medrol, MVI, vitamin C.  Per RN documentation, patient has non pitting edema to all extremities. Actual weight is likely lower than current weight reading.   NUTRITION - FOCUSED PHYSICAL EXAM:  unable to complete, working remotely due to COVID-19 restrictions  Diet Order:   Diet Order            Diet Carb Modified Fluid consistency: Thin; Room service appropriate? Yes  Diet effective now              EDUCATION NEEDS:   Not appropriate for education at this time  Skin:  Skin Assessment: Reviewed RN Assessment  Last BM:  7/28 (type 2)  Height:   Ht Readings from Last 1 Encounters:  06/03/19 5' (1.524 m)    Weight:   Wt Readings from Last 1 Encounters:  05/28/2019 72.6 kg    Ideal Body Weight:  48.2 kg  BMI:  Body mass index is 31.25 kg/m.  Estimated Nutritional Needs:   Kcal:  1650  Protein:  >/= 96 gm  Fluid:  >/= 1.7  L    Molli Barrows, RD, LDN, [place] Pager [phone] After Hours Pager [phone]

## 2019-06-04 NOTE — Progress Notes (Signed)
eLink Physician-Brief Progress Note Patient Name: Robert Mcdaniel DOB: 1951-06-22 MRN: [id number]   Date of Service  06/04/2019  HPI/Events of Note  Respiratory acidosis, Vt 6 ml/kg, PH 7.26  eICU Interventions  Will accept permissive hypercapnia for now as RR is already 35.         Kerry Kass [name] 06/04/2019, 5:54 AM

## 2019-06-04 NOTE — Progress Notes (Addendum)
NAME:  Moosa Bueche, MRN:  [id number], DOB:  29-Jul-1951, LOS: 2 ADMISSION DATE:  05/27/2019, CONSULTATION DATE:  7/27 REFERRING MD:  Kim/Krishnan, CHIEF COMPLAINT:  Dyspnea   Brief History   68 y/o male admitted on 7/20 with ARDS from COVID 19 pneumonia.  Past Medical History  Hypertension Hyperlipidemia DM2 CAD  Significant Hospital Events   7/27 admission to [facility]  Consults:  PCCM  Procedures:  7/28 CPR - 2 rounds EPI, 1 amp bicarb, ROSC  7/28 Intubation  7/28 CVC RIJ   Significant Diagnostic Tests:    Micro Data:  7/27 SARS-COV-2 > positive  Antimicrobials/OOVID Rx:  7/27 remdesivir>  7/27 decadron>  7/27 tocilizumab>   Interim history/subjective:   Patient suffered a cardiac arrest yesterday requiring intubation placed back on mechanical ventilation.  Still on mechanical life support requiring vasopressors.  Objective   Blood pressure 97/64, pulse (!) 59, temperature (!) 97.5 F (36.4 C), temperature source Oral, resp. rate (!) 31, height 5' (1.524 m), weight 72.6 kg, SpO2 99 %. CVP:  [6 mmHg-15 mmHg] 15 mmHg  Vent Mode: PRVC FiO2 (%):  [60 %-100 %] 60 % Set Rate:  [24 bmp-35 bmp] 35 bmp Vt Set:  [400 mL] 400 mL PEEP:  [10 cmH20-16 cmH20] 14 cmH20 Plateau Pressure:  [27 cmH20] 27 cmH20   Intake/Output Summary (Last 24 hours) at 06/04/2019 0816 Last data filed at 06/04/2019 0800 Gross per 24 hour  Intake 3573.98 ml  Output 695 ml  Net 2878.98 ml   Filed Weights   05/18/2019 0801  Weight: 72.6 kg    Examination:  General: Elderly male, intubated on mechanical life support, sedated, paralyzed unresponsive HENT: NCAT, no pupillary response however paralyzed PULM: Some crackles in the bilateral bases, bilateral ventilated breath sounds CV: Regular rate and rhythm, S1-S2 GI: Bowel sounds present, soft nontender nondistended MSK: n normal bulk and tone Neuro: Intubated sedated, paralyzed  Chest x-ray 06/03/2019: Diffuse bilateral interstitial alveolar  opacities as seen prior consistent with multi lobar pneumonia and ARDS. The patient's images have been independently reviewed by me.     Resolved Hospital Problem list     Assessment & Plan:  ARDS due to SARS-COV2 pneumonia Continue mechanical ventilation per ARDS protocol Target TVol 6-8cc/kgIBW Target Plateau Pressure < 30cm H20 Target driving pressure less than 15 cm of water Target PaO2 55-65: titrate PEEP/FiO2 per protocol As long as PaO2 to FiO2 ratio is less than 1:150 position in prone position for 16 hours a day Check CVP daily if CVL in place Target CVP less than 4, diurese as necessary Ventilator associated pneumonia prevention protocol  Complete wound is severe x5 days Decadron 6 mg p.o. daily x10 days Status post to collision that dosing  Shock Acute on chronic systolic heart failure Cardiomyopathy Status post cardiac arrest, PEA, with return of spontaneous circulation Echocardiogram 06/03/2019: Severely depressed left ventricular ejection fraction Continue vasopressor support with norepinephrine and vasopressin Continue to wean norepinephrine to maintain mean arterial pressure greater than 65 mmHg.  Once at approximately 10 mcg or less can discontinue vasopressin Until then can remain on both vasopressors.  His blood pressure per nursing staff has responded well to vasopressin dosing. Overall prognosis is poor.  Sedation needs while on mechanical ventilation We will discontinue continuous vecuronium today. Okay to use PRN vecuronium for significant ventilator dyssynchrony or desaturation of not tolerating settings. Continuous fentanyl dosing with PRN bolus Versed continuous with PRN bolus Goal RA SS at -4 until off all paralysis  CAD Stopped  home dose metoprolol as patient is on vasopressors  AKI: improved with IVF Continue to follow urine output and basic metabolic panel Replace electrolytes as needed We will likely need to discuss with nephrology. I have  discussed this with the hospitalist service  Hyperglycemia/DKA Per [facility]   Best practice:  Diet: regular diet Pain/Anxiety/Delirium protocol (if indicated): n/a VAP protocol (if indicated): n/a DVT prophylaxis: sub q heparin per COVID protocol GI prophylaxis: n/a Glucose control: SSI, Glargine per protocol Mobility: bed rest Code Status: full Family Communication: Per TRH Disposition: remain in ICU  Labs   CBC: Recent Labs  Lab 05/29/2019 0055 06/03/19 0550 06/03/19 1547 06/03/19 1553 06/03/19 1703 06/03/19 2025 06/04/19 0517 06/04/19 0530  WBC 6.1 6.3 7.2  --   --   --   --  13.2*  NEUTROABS 5.6 5.5  --   --   --   --   --  PENDING  HGB 13.4 12.2* 11.5* 11.2* 11.9* 11.6* 12.2* 10.6*  HCT 39.7 36.7* 36.6* 33.0* 35.0* 34.0* 36.0* 33.1*  MCV 89.2 89.3 96.6  --   --   --   --  92.5  PLT 115* 156 121*  --   --   --   --  196    Basic Metabolic Panel: Recent Labs  Lab 06/01/2019 1235 06/06/2019 2005 06/03/19 0550 06/03/19 1547 06/03/19 1553 06/03/19 1703 06/03/19 2025 06/03/19 2045 06/04/19 0517  NA 137 139 137 138 137 137 138 140 141  K 4.2 4.3 3.9 3.2* 3.5 4.2 4.7 4.7 4.5  CL 100 104 105 102  --   --   --  103  --   CO2 20* 20* 21* 17*  --   --   --  27  --   GLUCOSE 175* 187* 174* 555*  --   --   --  390*  --   BUN 68* 59* 55* 57*  --   --   --  62*  --   CREATININE 2.00* 1.56* 1.49* 1.92*  --   --   --  2.01*  --   CALCIUM 7.5* 7.8* 7.6* 9.1  --   --   --  7.5*  --   MG  --   --   --   --   --   --   --  2.3  --   PHOS  --   --   --   --   --   --   --  8.3*  --    GFR: Estimated Creatinine Clearance: 30.6 mL/min (A) (by C-G formula based on SCr of 2.01 mg/dL (H)). Recent Labs  Lab 05/25/2019 0055 05/30/2019 0311 05/26/2019 0413 06/03/19 0550 06/03/19 1547 06/03/19 2045 06/04/19 0530  PROCALCITON  --   --  0.72 0.40  --   --   --   WBC 6.1  --   --  6.3 7.2  --  13.2*  LATICACIDVEN 2.3* 1.2  --   --  >11.0* 1.6  --     Liver Function Tests: Recent Labs   Lab 05/18/2019 0055 06/03/19 0550 06/03/19 1547  AST 109* 123* 249*  ALT 34 38 176*  ALKPHOS 136* 144* 145*  BILITOT 0.8 0.7 0.7  PROT 7.0 6.9 5.5*  ALBUMIN 2.9* 2.9* 2.3*   No results for input(s): LIPASE, AMYLASE in the last 168 hours. No results for input(s): AMMONIA in the last 168 hours.  ABG    Component Value Date/Time   PHART  7.267 (L) 06/04/2019 0517   PCO2ART 68.8 (HH) 06/04/2019 0517   PO2ART 98.0 06/04/2019 0517   HCO3 31.6 (H) 06/04/2019 0517   TCO2 34 (H) 06/04/2019 0517   ACIDBASEDEF 1.0 06/03/2019 2025   O2SAT 96.0 06/04/2019 0517     Coagulation Profile: Recent Labs  Lab 05/09/2019 0055 06/03/19 1547  INR 1.0 1.2    Cardiac Enzymes: No results for input(s): CKTOTAL, CKMB, CKMBINDEX, TROPONINI in the last 168 hours.  HbA1C: Hgb A1c MFr Bld  Date/Time Value Ref Range Status  06/03/2019 05:50 AM 13.8 (H) 4.8 - 5.6 % Final    Comment:    (NOTE) Pre diabetes:          5.7%-6.4% Diabetes:              >6.4% Glycemic control for   <7.0% adults with diabetes     CBG: Recent Labs  Lab 06/04/19 0328 06/04/19 0431 06/04/19 0534 06/04/19 0637 06/04/19 0745  GLUCAP 84 92 111* 131* 186*    Discussed prognosis with Dr. [place] Pink.  I agree with his assessment that a repeat cardiac arrest with his current comorbidities would lead to his demise.  I would agree that the patient should be a DNR as repeat cardiopulmonary resuscitation would likely offer no additional benefit.  This patient is critically ill with multiple organ system failure; which, requires frequent high complexity decision making, assessment, support, evaluation, and titration of therapies. This was completed through the application of advanced monitoring technologies and extensive interpretation of multiple databases. During this encounter critical care time was devoted to patient care services described in this note for 34 minutes.   Garner Nash, DO [facility]  06/04/2019 8:18 AM  Personal pager: [phone] If unanswered, please page CCM On-call: [phone]

## 2019-06-04 NOTE — Progress Notes (Addendum)
PROGRESS NOTE  Robert Mcdaniel AJG:[id number] DOB: 1951/10/14 DOA: 05/09/2019  PCP: Patient, No Pcp Per  Brief History/Interval Summary: 68 year old male who speaks only Spanish with a past medical history of coronary artery disease, diabetes mellitus type 2 on insulin, possible chronic kidney disease unspecified stage presented with complains of shortness of breath dry cough.  Patient had nausea vomiting a few days ago.  Patient was found to be positive for COVID-19.  Chest x-ray showed pneumonia.  Patient was hospitalized for further management.  He was hypoxic.    Reason for Visit: Acute respiratory disease due to COVID-19  Consultants: Pulmonology  Procedures:   7/28: Intubated 7/28: Central line placement  7/28 transthoracic echocardiogram  1. The left ventricle has severely reduced systolic function, with an ejection fraction of 20-25%. Left ventricular diffuse hypokinesis.  2. Severe hypokinesis of the left ventricular, entire anterior wall.  3. The inferior vena cava was dilated in size with <50% respiratory variability.   Antibiotics: Anti-infectives (From admission, onward)   Start     Dose/Rate Route Frequency Ordered Stop   06/03/19 1100  remdesivir 100 mg in sodium chloride 0.9 % 250 mL IVPB     100 mg 500 mL/hr over 30 Minutes Intravenous Every 24 hours 05/17/2019 0927 06/07/19 1059   05/28/2019 1100  remdesivir 200 mg in sodium chloride 0.9 % 250 mL IVPB     200 mg 500 mL/hr over 30 Minutes Intravenous Once 05/11/2019 0927 05/11/2019 1124   05/30/2019 0500  azithromycin (ZITHROMAX) 500 mg in sodium chloride 0.9 % 250 mL IVPB  Status:  Discontinued     500 mg 250 mL/hr over 60 Minutes Intravenous Daily 05/28/2019 0416 05/21/2019 1247   06/04/2019 0430  cefTRIAXone (ROCEPHIN) 2 g in sodium chloride 0.9 % 100 mL IVPB  Status:  Discontinued     2 g 200 mL/hr over 30 Minutes Intravenous Daily 06/01/2019 0416 05/15/2019 1247       Subjective/Interval History: Patient had cardiac arrest  yesterday afternoon.  He was intubated.  He was successfully resuscitated.  He is intubated and sedated this morning.       Assessment/Plan:  Acute Hypoxic Resp. Failure due to Acute Covid 19 Viral Illness  COVID-19 Labs  Recent Labs    05/20/2019 0413 05/27/2019 0416 06/03/19 0550 06/03/19 1547 06/04/19 0530 06/04/19 0547  DDIMER  --   --  0.83* 5.59* 4.65*  --   FERRITIN  --  2,645* 4,314*  --   --  7,301*  LDH 1,112*  --   --   --   --   --   CRP  --  18.6* 17.8*  --   --  8.8*    Lab Results  Component Value Date   SARSCOV2NAA POSITIVE (A) 05/26/2019   SARSCOV2NAA Detected (A) 05/26/2019     Fever: Afebrile Oxygen requirements: Mechanical ventilation.  60% FiO2.  Saturating in the 90s.   Antibacterials: Was given ceftriaxone and azithromycin in the ED. Not continued. Remdesivir: Day 3 today Steroids: Dexamethasone changed over to Solu-Medrol 40 mg every 12 hours Diuretics: Not on diuretics on a scheduled basis Actemra: 1 dose given on 7/27 DVT Prophylaxis: On IV heparin  Research Studies: Not enrolled into any research studies yet.  Patient intubated sedated.  Pulmonology is following and managing.  Prognosis is poor due to his significantly acute systolic CHF.  From a COVID-19 standpoint he remains on Remdesivir and steroids.  CRP down to 8.8.  Ferritin 7301.  D-dimer 4.65.  We will repeat labs tomorrow.   Procalcitonin improved from 0.7 to 0.4.  He did get a dose of ceftriaxone and azithromycin in the emergency department.  Not continued. Lactic acid level was 2.3 initially.  Improved to 1.2.  Patient procalcitonin noted to 1.93.  WBC is 13.2 which could be due to steroids.  Continue to monitor off of antibiotics at this time.  Acute systolic CHF/cardiac arrest EF is around 20%.  Patient's cardiac arrest likely precipitated by hypoxia in the setting of congestive heart failure.  He had to be resuscitated twice.  He was given epinephrine bicarbonate and calcium  chloride.  He is on amiodarone infusion.  He is on IV heparin infusion.  He is also getting Levophed. No ACE inhibitor is due to renal failure, shock.  Patient's cardiac status was discussed with Dr. Aundra Dubin with the heart failure team.  He recommended venous blood gas.  This was done.  Saturation on that gas was 42%.  Dobutamine was recommended at 2.5 mcg/kg/min.  If patient's hemodynamics improve and if the venous blood saturations improved tomorrow morning and if CVP is still elevated then should consider giving Lasix.  Acute renal failure Baseline renal function is not known.  Patient could have CKD based on his history however this is unspecified.  Initially there was concern for hypovolemia.  Patient was given IV fluids with improvement in renal function.  However he went into cardiac arrest as discussed above.  Creatinine is slightly worse today.  Monitor urine output.  Discussed with nephrology.  Does not need dialysis at this time.  Discussed with critical care medicine.  Patient likely a poor candidate for CRRT.  Will discuss with family as well.  Transaminitis Significant bump in his AST and ALT are due to shock.  Continue to monitor.  History of diabetes mellitus type 2, uncontrolled with hyperglycemia/DKA Patient had elevated anion gap and was noted to have ketones in his urine.  Likely was in DKA as he did have nausea vomiting for a few days.  Patient was continued on IV insulin infusion.  His bicarbonate has improved.  His anion gap closed.  He was transitioned to long-acting insulin.  Once he was intubated he was switched back to IV insulin.  We will consider switching him back to long-acting insulin. His HbA1c is 13.8 implying very poor control at baseline.  He is supposedly on Lantus at home.  However his compliance is questionable.  Abdominal discomfort Patient was unable to specify what was causing him to be uneasy in his abdomen.  His abdomen is benign to examination.   History of  coronary artery disease status post CABG EKG shows nonspecific T wave changes.  No old EKG available for comparison.  High-sensitivity troponin levels not significantly elevated.  And then of course he had cardiac arrest as discussed above.  Continue aspirin.  Will use beta-blockade once his hemodynamics have improved.     Essential hypertension Now in shock as discussed above.  On Levophed.  Obesity  Estimated body mass index is 31.25 kg/m as calculated from the following:   Height as of this encounter: 5' (1.524 m).   Weight as of this encounter: 72.6 kg.  Goals of care Considering that patient has had 2 episodes of cardiac arrest requiring CPR, and with his significant CHF with a EF only of 20% the chances of meaningful recovery if he has another episode of cardiac arrest is extremely low.  Discussed with Dr. Valeta Harms with critical care medicine  who agrees.  We will make the patient DNR.  Will also discuss this issue with family members today.  Patient's daughter was updated.  Told about patient's worsening renal function as well.  She will discuss with her siblings.  Palliative medicine will be consulted.   DVT Prophylaxis: On IV heparin PUD Prophylaxis: Famotidine Code Status: DNR Family Communication: Discussed with multiple family members yesterday.  Will call his daughter again today. Disposition Plan: Continue ICU monitoring for now   Medications:  Scheduled:  amiodarone  150 mg Intravenous Once   artificial tears  1 application Both Eyes N3Z   aspirin  325 mg Per Tube Daily   chlorhexidine gluconate (MEDLINE KIT)  15 mL Mouth Rinse BID   Chlorhexidine Gluconate Cloth  6 each Topical Q0600   mouth rinse  15 mL Mouth Rinse 10 times per day   methylPREDNISolone (SOLU-MEDROL) injection  40 mg Intravenous Q12H   multivitamin with minerals  1 tablet Oral Daily   Continuous:  amiodarone 30 mg/hr (06/04/19 1021)   dextrose 5 % and 0.45% NaCl 75 mL/hr at 06/04/19 0410     famotidine (PEPCID) IV 20 mg (06/04/19 1015)   fentaNYL infusion INTRAVENOUS 60 mcg/hr (06/04/19 0700)   heparin 650 Units/hr (06/04/19 0700)   insulin 5.6 Units/hr (06/04/19 1326)   midazolam 2 mg/hr (06/04/19 1017)   norepinephrine (LEVOPHED) Adult infusion 2.027 mcg/min (06/04/19 0817)   remdesivir 100 mg in NS 250 mL 100 mg (06/04/19 1226)   JQB:HALPFXTK, fentaNYL, hydrALAZINE, midazolam, vecuronium   Objective:  Vital Signs  Vitals:   06/04/19 0828 06/04/19 0900 06/04/19 1011 06/04/19 1031  BP: (!) 119/58 100/65 99/66 (!) 119/59  Pulse: 60 (!) 59  60  Resp: (!) 35 (!) 28    Temp:      TempSrc:      SpO2: 98% 98%    Weight:      Height:        Intake/Output Summary (Last 24 hours) at 06/04/2019 1414 Last data filed at 06/04/2019 1226 Gross per 24 hour  Intake 3896.26 ml  Output 510 ml  Net 3386.26 ml   Filed Weights   06/06/2019 0801  Weight: 72.6 kg    General appearance: Intubated and sedated Resp: Coarse breath sounds bilaterally with crackles bilateral bases.  No wheezing or rhonchi Cardio: S1-S2 is normal regular.  No S3-S4.  No rubs murmurs or bruit GI: Abdomen is soft.  Nontender nondistended.  Bowel sounds are present normal.  No masses organomegaly Extremities: Mild edema bilateral lower extremity Neurologic: No focal neurological deficit    Lab Results:  Data Reviewed: I have personally reviewed following labs and imaging studies  CBC: Recent Labs  Lab 05/12/2019 0055 06/03/19 0550 06/03/19 1547 06/03/19 1553 06/03/19 1703 06/03/19 2025 06/04/19 0517 06/04/19 0530  WBC 6.1 6.3 7.2  --   --   --   --  13.2*  NEUTROABS 5.6 5.5  --   --   --   --   --  11.7*  HGB 13.4 12.2* 11.5* 11.2* 11.9* 11.6* 12.2* 10.6*  HCT 39.7 36.7* 36.6* 33.0* 35.0* 34.0* 36.0* 33.1*  MCV 89.2 89.3 96.6  --   --   --   --  92.5  PLT 115* 156 121*  --   --   --   --  240    Basic Metabolic Panel: Recent Labs  Lab 05/25/2019 2005 06/03/19 0550  06/03/19 1547  06/03/19 1703 06/03/19 2025 06/03/19 2045 06/04/19 0517 06/04/19  0530  NA 139 137 138   < > 137 138 140 141 142  K 4.3 3.9 3.2*   < > 4.2 4.7 4.7 4.5 4.6  CL 104 105 102  --   --   --  103  --  104  CO2 20* 21* 17*  --   --   --  27  --  29  GLUCOSE 187* 174* 555*  --   --   --  390*  --  115*  BUN 59* 55* 57*  --   --   --  62*  --  65*  CREATININE 1.56* 1.49* 1.92*  --   --   --  2.01*  --  2.42*  CALCIUM 7.8* 7.6* 9.1  --   --   --  7.5*  --  6.9*  MG  --   --   --   --   --   --  2.3  --   --   PHOS  --   --   --   --   --   --  8.3*  --   --    < > = values in this interval not displayed.    GFR: Estimated Creatinine Clearance: 25.4 mL/min (A) (by C-G formula based on SCr of 2.42 mg/dL (H)).  Liver Function Tests: Recent Labs  Lab 06/03/2019 0055 06/03/19 0550 06/03/19 1547 06/04/19 0530  AST 109* 123* 249* 501*  ALT 34 38 176* 198*  ALKPHOS 136* 144* 145* 181*  BILITOT 0.8 0.7 0.7 0.6  PROT 7.0 6.9 5.5* 5.2*  ALBUMIN 2.9* 2.9* 2.3* 2.2*     Coagulation Profile: Recent Labs  Lab 06/03/2019 0055 06/03/19 1547  INR 1.0 1.2     CBG: Recent Labs  Lab 06/04/19 1000 06/04/19 1106 06/04/19 1212 06/04/19 1218 06/04/19 1307  GLUCAP 238* 265* 52* 246* 222*    Anemia Panel: Recent Labs    06/03/19 0550 06/04/19 0547  FERRITIN 4,314* 7,301*    Recent Results (from the past 240 hour(s))  Novel Coronavirus, NAA ([facility])     Status: Abnormal   Collection Time: 05/26/19 12:00 AM  Result Value Ref Range Status   SARS-CoV-2, NAA Detected (A) Not Detected Final    Comment: This test was developed and its performance characteristics determined by [employer]. This test has not been FDA cleared or approved. This test has been authorized by FDA under an Emergency Use Authorization (EUA). This test is only authorized for the duration of time the declaration that circumstances exist justifying the authorization of the emergency use of in  vitro diagnostic tests for detection of SARS-CoV-2 virus and/or diagnosis of COVID-19 infection under section 564(b)(1) of the Act, 21 U.S.C. 027OZD-6(U)(4), unless the authorization is terminated or revoked sooner. When diagnostic testing is negative, the possibility of a false negative result should be considered in the context of a patient's recent exposures and the presence of clinical signs and symptoms consistent with COVID-19. An individual without symptoms of COVID-19 and who is not shedding SARS-CoV-2 virus would expect to have a negative (not detected) result in this assay.   Culture, blood (Routine x 2)     Status: None (Preliminary result)   Collection Time: 05/23/2019 12:55 AM   Specimen: BLOOD  Result Value Ref Range Status   Specimen Description   Final    BLOOD LEFT ANTECUBITAL Performed at [facility] [address], [place], [place] [address]    Special Requests  Final    BOTTLES DRAWN AEROBIC AND ANAEROBIC Blood Culture results may not be optimal due to an excessive volume of blood received in culture bottles Performed at [facility] [address] [place], [place] [address]    Culture   Final    NO GROWTH 2 DAYS Performed at [facility] [address], [place], [place] [address]    Report Status PENDING  Incomplete  Culture, blood (Routine x 2)     Status: None (Preliminary result)   Collection Time: 05/25/2019 12:59 AM   Specimen: BLOOD RIGHT FOREARM  Result Value Ref Range Status   Specimen Description   Final    BLOOD RIGHT FOREARM Performed at [facility], [place] [address] [place], [place] [address]    Special Requests   Final    BOTTLES DRAWN AEROBIC AND ANAEROBIC Blood Culture adequate volume Performed at [facility] [address] [place], [place] [address]    Culture   Final    NO GROWTH 2 DAYS Performed at [facility] [address], [place], [place] [address]     Report Status PENDING  Incomplete  SARS Coronavirus 2 (CEPHEID - Performed in [facility] hospital lab), Hosp Order     Status: Abnormal   Collection Time: 05/24/2019  3:52 AM   Specimen: Nasopharyngeal Swab  Result Value Ref Range Status   SARS Coronavirus 2 POSITIVE (A) NEGATIVE Final    Comment: RESULT CALLED TO, READ BACK BY AND VERIFIED WITH: S.BINGHAM RN AT 5643 ON 06/01/2019 BY S.VANHOORNE (NOTE) If result is NEGATIVE SARS-CoV-2 target nucleic acids are NOT DETECTED. The SARS-CoV-2 RNA is generally detectable in upper and lower  respiratory specimens during the acute phase of infection. The lowest  concentration of SARS-CoV-2 viral copies this assay can detect is 250  copies / mL. A negative result does not preclude SARS-CoV-2 infection  and should not be used as the sole basis for treatment or other  patient management decisions.  A negative result may occur with  improper specimen collection / handling, submission of specimen other  than nasopharyngeal swab, presence of viral mutation(s) within the  areas targeted by this assay, and inadequate number of viral copies  (<250 copies / mL). A negative result must be combined with clinical  observations, patient history, and epidemiological information. If result is POSITIVE SARS-CoV-2 target nucleic acids are  DETECTED. The SARS-CoV-2 RNA is generally detectable in upper and lower  respiratory specimens during the acute phase of infection.  Positive  results are indicative of active infection with SARS-CoV-2.  Clinical  correlation with patient history and other diagnostic information is  necessary to determine patient infection status.  Positive results do  not rule out bacterial infection or co-infection with other viruses. If result is PRESUMPTIVE POSTIVE SARS-CoV-2 nucleic acids MAY BE PRESENT.   A presumptive positive result was obtained on the submitted specimen  and confirmed on repeat testing.  While 2019 novel  coronavirus  (SARS-CoV-2) nucleic acids may be present in the submitted sample  additional confirmatory testing may be necessary for epidemiological  and / or clinical management purposes  to differentiate between  SARS-CoV-2 and other Sarbecovirus currently known to infect humans.  If clinically indicated additional testing with an alternate test  methodology  [phone]) is advised. The SARS-CoV-2 RNA is generally  detectable in upper and lower respiratory specimens during the acute  phase of infection. The expected result is Negative. Fact Sheet  for Patients:  StrictlyIdeas.no Fact Sheet for Healthcare Providers: BankingDealers.co.za This test is not yet approved or cleared by the [place] FDA and has been authorized for detection and/or diagnosis of SARS-CoV-2 by FDA under an Emergency Use Authorization (EUA).  This EUA will remain in effect (meaning this test can be used) for the duration of the COVID-19 declaration under Section 564(b)(1) of the Act, 21 U.S.C. section 360bbb-3(b)(1), unless the authorization is terminated or revoked sooner. Performed at [facility], [place] [address] [place], [place] [address]   MRSA PCR Screening     Status: None   Collection Time: 05/16/2019  4:54 PM   Specimen: Nasal Mucosa; Nasopharyngeal  Result Value Ref Range Status   MRSA by PCR NEGATIVE NEGATIVE Final    Comment:        The GeneXpert MRSA Assay (FDA approved for NASAL specimens only), is one component of a comprehensive MRSA colonization surveillance program. It is not intended to diagnose MRSA infection nor to guide or monitor treatment for MRSA infections. Performed at [facility], [place] [address], [place], [place] [address]       Radiology Studies: Dg Chest Port 1 View  Result Date: 06/04/2019 CLINICAL DATA:  ARDS. EXAM: PORTABLE CHEST 1 VIEW COMPARISON:  06/03/2019 FINDINGS:  Endotracheal tube is 3.6 cm above the carina. Right jugular central line in the upper SVC region. Again noted is a dual chamber cardiac pacemaker. Nasogastric tube in the left upper abdomen with the tip near the gastric fundus region. Again noted are bilateral parenchymal lung densities. Slightly improved aeration in both lungs compared to the recent comparison examination. Negative for a pneumothorax. IMPRESSION: 1. Improved aeration in both lungs with persistent bilateral parenchymal lung disease. 2. Support apparatuses appear to be appropriately positioned. Electronically Signed   By: Markus Daft M.D.   On: 06/04/2019 11:41   Dg Chest Port 1 View  Result Date: 06/03/2019 CLINICAL DATA:  Intubated EXAM: PORTABLE CHEST 1 VIEW COMPARISON:  05/10/2019 FINDINGS: Diffuse bilateral interstitial and patchy alveolar airspace opacities which are worsening. No pleural effusion or pneumothorax. Stable cardiomediastinal silhouette. Endotracheal tube with the tip extending into the left mainstem bronchus. Recommend retracting the endotracheal tube approximately 4 cm. Nasogastric tube coursing below the diaphragm. Right jugular central venous catheter with the tip projecting over the SVC. Dual lead cardiac pacemaker again noted. IMPRESSION: 1. Worsening diffuse bilateral interstitial and patchy alveolar airspace opacities which may reflect worsening pulmonary edema versus multilobar pneumonia. 2. Endotracheal tube with the tip extending into the left mainstem bronchus. Recommend retracting the endotracheal tube approximately 4 cm. Electronically Signed   By: Kathreen Devoid   On: 06/03/2019 16:26       LOS: 2 days   [facility] Pager on [email or web address]  06/04/2019, 2:14 PM

## 2019-06-05 ENCOUNTER — Inpatient Hospital Stay (HOSPITAL_COMMUNITY): Payer: HRSA Program

## 2019-06-05 DIAGNOSIS — Z9911 Dependence on respirator [ventilator] status: Secondary | ICD-10-CM | POA: Diagnosis not present

## 2019-06-05 DIAGNOSIS — Z7189 Other specified counseling: Secondary | ICD-10-CM | POA: Diagnosis not present

## 2019-06-05 DIAGNOSIS — J80 Acute respiratory distress syndrome: Secondary | ICD-10-CM | POA: Diagnosis not present

## 2019-06-05 DIAGNOSIS — G931 Anoxic brain damage, not elsewhere classified: Secondary | ICD-10-CM

## 2019-06-05 DIAGNOSIS — J9601 Acute respiratory failure with hypoxia: Secondary | ICD-10-CM | POA: Diagnosis not present

## 2019-06-05 DIAGNOSIS — G9341 Metabolic encephalopathy: Secondary | ICD-10-CM

## 2019-06-05 DIAGNOSIS — U071 COVID-19: Secondary | ICD-10-CM | POA: Diagnosis not present

## 2019-06-05 DIAGNOSIS — E785 Hyperlipidemia, unspecified: Secondary | ICD-10-CM

## 2019-06-05 DIAGNOSIS — E119 Type 2 diabetes mellitus without complications: Secondary | ICD-10-CM

## 2019-06-05 DIAGNOSIS — N179 Acute kidney failure, unspecified: Secondary | ICD-10-CM | POA: Diagnosis not present

## 2019-06-05 DIAGNOSIS — R4189 Other symptoms and signs involving cognitive functions and awareness: Secondary | ICD-10-CM

## 2019-06-05 DIAGNOSIS — Z515 Encounter for palliative care: Secondary | ICD-10-CM | POA: Diagnosis not present

## 2019-06-05 DIAGNOSIS — I469 Cardiac arrest, cause unspecified: Secondary | ICD-10-CM

## 2019-06-05 DIAGNOSIS — Z794 Long term (current) use of insulin: Secondary | ICD-10-CM

## 2019-06-05 LAB — GLUCOSE, CAPILLARY
Glucose-Capillary: 245 mg/dL — ABNORMAL HIGH (ref 70–99)
Glucose-Capillary: 314 mg/dL — ABNORMAL HIGH (ref 70–99)
Glucose-Capillary: 209 mg/dL — ABNORMAL HIGH (ref 70–99)
Glucose-Capillary: 346 mg/dL — ABNORMAL HIGH (ref 70–99)
Glucose-Capillary: 449 mg/dL — ABNORMAL HIGH (ref 70–99)
Glucose-Capillary: 377 mg/dL — ABNORMAL HIGH (ref 70–99)
Glucose-Capillary: 498 mg/dL — ABNORMAL HIGH (ref 70–99)

## 2019-06-05 LAB — COMPREHENSIVE METABOLIC PANEL
ALT: 158 U/L — ABNORMAL HIGH (ref 0–44)
AST: 235 U/L — ABNORMAL HIGH (ref 15–41)
Albumin: 2.3 g/dL — ABNORMAL LOW (ref 3.5–5.0)
Alkaline Phosphatase: 195 U/L — ABNORMAL HIGH (ref 38–126)
Anion gap: 13 (ref 5–15)
BUN: 77 mg/dL — ABNORMAL HIGH (ref 8–23)
CO2: 28 mmol/L (ref 22–32)
Calcium: 6.5 mg/dL — ABNORMAL LOW (ref 8.9–10.3)
Chloride: 98 mmol/L (ref 98–111)
Creatinine, Ser: 3.28 mg/dL — ABNORMAL HIGH (ref 0.61–1.24)
GFR calc Af Amer: 22 mL/min — ABNORMAL LOW (ref >60)
GFR calc non Af Amer: 19 mL/min — ABNORMAL LOW (ref >60)
Glucose, Bld: 271 mg/dL — ABNORMAL HIGH (ref 70–99)
Potassium: 3.8 mmol/L (ref 3.5–5.1)
Sodium: 139 mmol/L (ref 135–145)
Total Bilirubin: 0.5 mg/dL (ref 0.3–1.2)
Total Protein: 5.2 g/dL — ABNORMAL LOW (ref 6.5–8.1)

## 2019-06-05 LAB — CBC WITH DIFFERENTIAL/PLATELET
Abs Immature Granulocytes: 0.14 10*3/uL — ABNORMAL HIGH (ref 0.00–0.07)
Basophils Absolute: 0 10*3/uL (ref 0.0–0.1)
Basophils Relative: 0 %
Eosinophils Absolute: 0 10*3/uL (ref 0.0–0.5)
Eosinophils Relative: 0 %
HCT: 34.3 % — ABNORMAL LOW (ref 39.0–52.0)
Hemoglobin: 11.1 g/dL — ABNORMAL LOW (ref 13.0–17.0)
Immature Granulocytes: 1 %
Lymphocytes Relative: 6 %
Lymphs Abs: 0.8 10*3/uL (ref 0.7–4.0)
MCH: 30 pg (ref 26.0–34.0)
MCHC: 32.4 g/dL (ref 30.0–36.0)
MCV: 92.7 fL (ref 80.0–100.0)
Monocytes Absolute: 0.3 10*3/uL (ref 0.1–1.0)
Monocytes Relative: 3 %
Neutro Abs: 12 10*3/uL — ABNORMAL HIGH (ref 1.7–7.7)
Neutrophils Relative %: 90 %
Platelets: 156 10*3/uL (ref 150–400)
RBC: 3.7 MIL/uL — ABNORMAL LOW (ref 4.22–5.81)
RDW: 14.6 % (ref 11.5–15.5)
WBC: 13.2 10*3/uL — ABNORMAL HIGH (ref 4.0–10.5)
nRBC: 0.2 % (ref 0.0–0.2)

## 2019-06-05 LAB — MAGNESIUM
Magnesium: 2.2 mg/dL (ref 1.7–2.4)
Magnesium: 2.3 mg/dL (ref 1.7–2.4)

## 2019-06-05 LAB — BASIC METABOLIC PANEL
Anion gap: 16 — ABNORMAL HIGH (ref 5–15)
BUN: 88 mg/dL — ABNORMAL HIGH (ref 8–23)
CO2: 25 mmol/L (ref 22–32)
Calcium: 6.1 mg/dL — CL (ref 8.9–10.3)
Chloride: 94 mmol/L — ABNORMAL LOW (ref 98–111)
Creatinine, Ser: 3.93 mg/dL — ABNORMAL HIGH (ref 0.61–1.24)
GFR calc Af Amer: 17 mL/min — ABNORMAL LOW (ref >60)
GFR calc non Af Amer: 15 mL/min — ABNORMAL LOW (ref >60)
Glucose, Bld: 505 mg/dL (ref 70–99)
Potassium: 5.5 mmol/L — ABNORMAL HIGH (ref 3.5–5.1)
Sodium: 135 mmol/L (ref 135–145)

## 2019-06-05 LAB — PHOSPHORUS
Phosphorus: 6.4 mg/dL — ABNORMAL HIGH (ref 2.5–4.6)
Phosphorus: 7.6 mg/dL — ABNORMAL HIGH (ref 2.5–4.6)

## 2019-06-05 LAB — C-REACTIVE PROTEIN: CRP: 6.8 mg/dL — ABNORMAL HIGH (ref <1.0)

## 2019-06-05 LAB — D-DIMER, QUANTITATIVE: D-Dimer, Quant: 3.11 ug/mL-FEU — ABNORMAL HIGH (ref 0.00–0.50)

## 2019-06-05 LAB — HEPARIN LEVEL (UNFRACTIONATED)
Heparin Unfractionated: 1.06 IU/mL — ABNORMAL HIGH (ref 0.30–0.70)
Heparin Unfractionated: 1.42 IU/mL — ABNORMAL HIGH (ref 0.30–0.70)
Heparin Unfractionated: 1.2 IU/mL — ABNORMAL HIGH (ref 0.30–0.70)

## 2019-06-05 LAB — ALBUMIN: Albumin: 2.8 g/dL — ABNORMAL LOW (ref 3.5–5.0)

## 2019-06-05 LAB — FERRITIN: Ferritin: 7410 ng/mL — ABNORMAL HIGH (ref 24–336)

## 2019-06-05 MED ORDER — ASPIRIN 81 MG PO CHEW
81.0000 mg | CHEWABLE_TABLET | Freq: Every day | ORAL | Status: DC
  Administered 2019-06-06 – 2019-06-08 (×3): 81 mg via ORAL
  Filled 2019-06-05 (×3): qty 1

## 2019-06-05 MED ORDER — ASPIRIN 81 MG PO CHEW: 81.0000 mg | CHEWABLE_TABLET | Freq: Every day | ORAL | Status: DC

## 2019-06-05 MED ORDER — INSULIN ASPART 100 UNIT/ML ~~LOC~~ SOLN
0.0000 [IU] | SUBCUTANEOUS | Status: DC
  Administered 2019-06-05 – 2019-06-06 (×2): 20 [IU] via SUBCUTANEOUS

## 2019-06-05 MED ORDER — HEPARIN SODIUM (PORCINE) 10000 UNIT/ML IJ SOLN
7500.0000 [IU] | Freq: Three times a day (TID) | INTRAMUSCULAR | Status: DC
  Administered 2019-06-05 – 2019-06-07 (×6): 7500 [IU] via SUBCUTANEOUS
  Filled 2019-06-05 (×6): qty 1

## 2019-06-05 MED ORDER — INSULIN DETEMIR 100 UNIT/ML ~~LOC~~ SOLN
15.0000 [IU] | Freq: Two times a day (BID) | SUBCUTANEOUS | Status: DC
  Administered 2019-06-05: 15 [IU] via SUBCUTANEOUS
  Filled 2019-06-05 (×2): qty 0.15

## 2019-06-05 MED ORDER — CALCIUM GLUCONATE-NACL 1-0.675 GM/50ML-% IV SOLN
1.0000 g | Freq: Once | INTRAVENOUS | Status: AC
  Administered 2019-06-05: 1000 mg via INTRAVENOUS
  Filled 2019-06-05: qty 50

## 2019-06-05 MED ORDER — INSULIN ASPART 100 UNIT/ML ~~LOC~~ SOLN
20.0000 [IU] | Freq: Once | SUBCUTANEOUS | Status: AC
  Administered 2019-06-05: 17:00:00 20 [IU] via SUBCUTANEOUS

## 2019-06-05 MED ORDER — FUROSEMIDE 10 MG/ML IJ SOLN
80.0000 mg | Freq: Four times a day (QID) | INTRAMUSCULAR | Status: AC
  Administered 2019-06-05 – 2019-06-06 (×4): 80 mg via INTRAVENOUS
  Filled 2019-06-05 (×4): qty 8

## 2019-06-05 MED ORDER — INSULIN ASPART 100 UNIT/ML ~~LOC~~ SOLN
9.0000 [IU] | SUBCUTANEOUS | Status: DC
  Administered 2019-06-05 – 2019-06-06 (×3): 9 [IU] via SUBCUTANEOUS

## 2019-06-05 MED ORDER — POTASSIUM CHLORIDE 20 MEQ/15ML (10%) PO SOLN
20.0000 meq | Freq: Two times a day (BID) | ORAL | Status: AC
  Administered 2019-06-05 (×2): 20 meq
  Filled 2019-06-05 (×2): qty 15

## 2019-06-05 MED ORDER — ALBUMIN HUMAN 25 % IV SOLN
25.0000 g | Freq: Four times a day (QID) | INTRAVENOUS | Status: AC
  Administered 2019-06-05 – 2019-06-06 (×4): 25 g via INTRAVENOUS
  Filled 2019-06-05: qty 50
  Filled 2019-06-05: qty 100
  Filled 2019-06-05: qty 50
  Filled 2019-06-05 (×2): qty 100

## 2019-06-05 MED ORDER — INSULIN ASPART 100 UNIT/ML ~~LOC~~ SOLN
20.0000 [IU] | Freq: Once | SUBCUTANEOUS | Status: AC
  Administered 2019-06-05: 20 [IU] via SUBCUTANEOUS

## 2019-06-05 NOTE — Progress Notes (Addendum)
Pt has very low urine output despite lasix and albumin dose. Arterial SBP trends in the 180-190s. Message sent to Dr. Roderic Palau. Also notified that per EEG tech, EEG can only be done after consult to Neuro, Latest CBG = 449

## 2019-06-05 NOTE — Consult Note (Signed)
Consultation Note Date: 06/05/2019   Patient Name: Robert Mcdaniel  DOB: [birth year]  MRN: [id number]  Age / Sex: 68 y.o., male  PCP: Patient, No Pcp Per Referring Physician: Kathie Dike, MD  Reason for Consultation: Establishing goals of care  HPI/Patient Profile: 68 y.o. male  with past medical history of CAD, T2DM, HLD, and HTN admitted on 05/19/2019 with dry cough and dyspnea. Found to be COVID positive. 7/28 patient experienced cardiac arrest - resuscitated twice. Briefly required pressors. Now heis on ventilator, unresponsive - off sedation since AM. Found to have EF of 20%. Also with acute renal failure - poor urine output, creatinine/BUN rising. PMT consulted for [place].   Clinical Assessment and Goals of Care: I have reviewed medical records including EPIC notes, labs and imaging, received report from RN - relays that patient remains unresponsive off of sedation, poor urine output.  I then spoke with patient's daughter, Robert Mcdaniel,  to discuss diagnosis prognosis, [place], EOL wishes, disposition and options.  I introduced Palliative Medicine as specialized medical care for people living with serious illness. It focuses on providing relief from the symptoms and stress of a serious illness. The goal is to improve quality of life for both the patient and the family.  Robert Mcdaniel tells me about patient's health problems prior to admission - speaks of uncontrolled diabetes and he has a pacemaker. She also shares that he recently had ?cardiac cath and she believes he was told he needed intervention but was too high risk. She tells me he had good function - could independently care for himself.   She shares that patient is married and his wife is primary Media planner - however, wife does not speak [place]. Patient has total of 2 sons and 2 daughters. At this time, medical information is being communicated with adult children who are relaying information to  patient's wife.   Elizeth then added her siblings to the call to include them in [place] conversation - apparently siblings live 15 hours away, [place] and patient's wife are only local family.    We discussed his current illness and what it means in the larger context of his on-going co-morbidities.  Natural disease trajectory and expectations at EOL were discussed. We specifically discussed concern about his mental status, heart failure, vent dependence, and kidney failure. Discussed concern of anoxic brain injury. Family understands watching for some neurologic recovery will guide other care decisions.   I also gently, but frankly discussed overall poor prognosis and concern patient will not be able to have meaningful recovery - they express understanding to this. We discussed option for liberation from ventilator and allowing a natural death if it becomes more clear that meaningful recovery is unlikely. They expressed understanding to this as well. Family was emotional, but reasonable throughout conversation. Emotional support provided.   Planned to follow up with patient's daughter tomorrow to continue to provide updates, offer support, and guide goals of care.   Questions and concerns were addressed. The family was encouraged to call with questions or concerns.   Primary Decision Maker NEXT OF KIN - patient's wife - she does not speak English - children have been providing updates to her    SUMMARY OF RECOMMENDATIONS   - initial palliative discussion with family - updates provided, discussed poor prognosis and all questions addressed - PMT will continue to provide support to family and address [place]  Code Status/Advance Care Planning:  DNR - this was decided earlier and not discussed during our conversation  Symptom Management:   Per primary - currently unresponsive and all sedation being held   Prognosis:   Unable to determine  Discharge Planning: To Be Determined      Primary  Diagnoses: Present on Admission: . COVID-19 virus infection . CAP (community acquired pneumonia) . Essential hypertension . Hyperlipidemia . Acute renal failure (ARF) ([place]) . Protein-calorie malnutrition, severe ([place])   I have reviewed the medical record, interviewed the patient and family, and examined the patient. The following aspects are pertinent.  Past Medical History:  Diagnosis Date  . CAD (coronary artery disease)   . Diabetes ([place])   . Hyperlipidemia   . Hypertension    Social History   Socioeconomic History  . Marital status: Married    Spouse name: Not on file  . Number of children: Not on file  . Years of education: Not on file  . Highest education level: Not on file  Occupational History  . Not on file  Social Needs  . Financial resource strain: Not on file  . Food insecurity    Worry: Not on file    Inability: Not on file  . Transportation needs    Medical: Not on file    Non-medical: Not on file  Tobacco Use  . Smoking status: Former Research scientist (life sciences)  . Smokeless tobacco: Never Used  Substance and Sexual Activity  . Alcohol use: Never    Frequency: Never  . Drug use: Never  . Sexual activity: Not Currently  Lifestyle  . Physical activity    Days per week: Not on file    Minutes per session: Not on file  . Stress: Not on file  Relationships  . Social Herbalist on phone: Not on file    Gets together: Not on file    Attends religious service: Not on file    Active member of club or organization: Not on file    Attends meetings of clubs or organizations: Not on file    Relationship status: Not on file  Other Topics Concern  . Not on file  Social History Narrative  . Not on file   Family History  Problem Relation Age of Onset  . Heart attack Father    Scheduled Meds: . amiodarone  150 mg Intravenous Once  . artificial tears  1 application Both Eyes T3M  . [START ON 06/06/2019] aspirin  81 mg Oral Daily  . chlorhexidine gluconate  (MEDLINE KIT)  15 mL Mouth Rinse BID  . Chlorhexidine Gluconate Cloth  6 each Topical Q0600  . furosemide  80 mg Intravenous Q6H  . heparin injection (subcutaneous)  7,500 Units Subcutaneous Q8H  . insulin aspart  0-15 Units Subcutaneous Q4H  . insulin aspart  20 Units Subcutaneous Once  . insulin aspart  9 Units Subcutaneous Q4H  . insulin detemir  15 Units Subcutaneous Q12H  . mouth rinse  15 mL Mouth Rinse 10 times per day  . methylPREDNISolone (SOLU-MEDROL) injection  40 mg Intravenous Q12H  . multivitamin with minerals  1 tablet Oral Daily  . potassium chloride  20 mEq Per Tube BID   Continuous Infusions: . albumin human Stopped (06/05/19 1346)  . amiodarone 30 mg/hr (06/05/19 1400)  . dextrose    . DOBUTamine Stopped (06/05/19 0316)  . famotidine (PEPCID) IV Stopped (06/05/19 4680)  . feeding supplement (VITAL AF 1.2 CAL) 1,000 mL (06/05/19 1017)  . fentaNYL infusion INTRAVENOUS Stopped (06/05/19 0600)  . midazolam Stopped (06/05/19 0400)  . norepinephrine (LEVOPHED)  Adult infusion Stopped (06/04/19 2036)  . remdesivir 100 mg in NS 250 mL Stopped (06/05/19 1245)   PRN Meds:.dextrose, dextrose, fentaNYL, hydrALAZINE, midazolam No Known Allergies  Vital Signs: BP (!) 143/70   Pulse 63   Temp (!) 94.5 F (34.7 C) (Oral)   Resp (!) 35   Ht 5' (1.524 m)   Wt 82 kg   SpO2 92%   BMI 35.31 kg/m  Pain Scale: CPOT   Pain Score: 0-No pain   SpO2: SpO2: 92 % O2 Device:SpO2: 92 % O2 Flow Rate: .O2 Flow Rate (L/min): (S) 25 L/min  IO: Intake/output summary:   Intake/Output Summary (Last 24 hours) at 06/05/2019 1623 Last data filed at 06/05/2019 1400 Gross per 24 hour  Intake 5145.68 ml  Output 400 ml  Net 4745.68 ml    LBM: Last BM Date: 06/01/19 Baseline Weight: Weight: 72.6 kg Most recent weight: Weight: 82 kg     Palliative Assessment/Data: PPS 30% (on tube feeding)    The above conversation was completed via telephone due to the visitor restrictions during the  COVID-19 pandemic. Thorough chart review and discussion with necessary members of the care team was completed as part of assessment. All issues were discussed and addressed but no physical exam was performed.  Time Total: 50 minutes Greater than 50%  of this time was spent counseling and coordinating care related to the above assessment and plan.  Juel Burrow, DNP, AGNP-C Palliative Medicine Team [phone] Pager: [phone]

## 2019-06-05 NOTE — Progress Notes (Signed)
PROGRESS NOTE  Robert Mcdaniel ZDG:[id number] DOB: May 31, 1951 DOA: 05/26/2019  PCP: Patient, No Pcp Per  Brief History/Interval Summary: 68 year old male who speaks only Spanish with a past medical history of coronary artery disease, diabetes mellitus type 2 on insulin, possible chronic kidney disease unspecified stage presented with complains of shortness of breath dry cough.  Patient had nausea vomiting a few days ago.  Patient was found to be positive for COVID-19.  Chest x-ray showed pneumonia.  Patient was hospitalized for further management.  He was hypoxic.    Reason for Visit: Acute respiratory disease due to COVID-19  Consultants: Pulmonology  Procedures:   7/28: Intubated 7/28: Central line placement  7/28 transthoracic echocardiogram  1. The left ventricle has severely reduced systolic function, with an ejection fraction of 20-25%. Left ventricular diffuse hypokinesis.  2. Severe hypokinesis of the left ventricular, entire anterior wall.  3. The inferior vena cava was dilated in size with <50% respiratory variability.   Antibiotics: Anti-infectives (From admission, onward)   Start     Dose/Rate Route Frequency Ordered Stop   06/03/19 1100  remdesivir 100 mg in sodium chloride 0.9 % 250 mL IVPB     100 mg 500 mL/hr over 30 Minutes Intravenous Every 24 hours 05/11/2019 0927 06/07/19 1059   05/07/2019 1100  remdesivir 200 mg in sodium chloride 0.9 % 250 mL IVPB     200 mg 500 mL/hr over 30 Minutes Intravenous Once 05/25/2019 0927 05/07/2019 1124   06/03/2019 0500  azithromycin (ZITHROMAX) 500 mg in sodium chloride 0.9 % 250 mL IVPB  Status:  Discontinued     500 mg 250 mL/hr over 60 Minutes Intravenous Daily 05/08/2019 0416 06/01/2019 1247   05/15/2019 0430  cefTRIAXone (ROCEPHIN) 2 g in sodium chloride 0.9 % 100 mL IVPB  Status:  Discontinued     2 g 200 mL/hr over 30 Minutes Intravenous Daily 05/25/2019 0416 05/08/2019 1247       Subjective/Interval History: Patient remains intubated.   No significant withdrawal to pain or gag reflex.  He remains unresponsive.    Assessment/Plan:  Acute Hypoxic Resp. Failure due to Acute Covid 19 Viral Illness  COVID-19 Labs  Recent Labs    06/03/19 0550 06/03/19 1547 06/04/19 0530 06/04/19 0547 06/05/19 0325  DDIMER 0.83* 5.59* 4.65*  --  3.11*  FERRITIN 4,314*  --   --  7,301* 7,410*  CRP 17.8*  --   --  8.8* 6.8*    Lab Results  Component Value Date   SARSCOV2NAA POSITIVE (A) 05/22/2019   SARSCOV2NAA Detected (A) 05/26/2019     Fever: Afebrile Oxygen requirements: Mechanical ventilation.  60% FiO2.  Saturating in the 90s.   Antibacterials: Was given ceftriaxone and azithromycin in the ED. Not continued. Remdesivir: Day 3 today Steroids: Dexamethasone changed over to Solu-Medrol 40 mg every 12 hours Diuretics: Not on diuretics on a scheduled basis Actemra: 1 dose given on 7/27 DVT Prophylaxis: On IV heparin  Research Studies: Not enrolled into any research studies yet.  Patient intubated sedated.  Pulmonology is following and managing.  Prognosis is poor due to his significantly acute systolic CHF.  From a COVID-19 standpoint he remains on Remdesivir and steroids.  CRP down to 8.8.  Ferritin 7301.  D-dimer 4.65.  We will repeat labs tomorrow.   Procalcitonin improved from 0.7 to 0.4.  He did get a dose of ceftriaxone and azithromycin in the emergency department.  Not continued. Lactic acid level was 2.3 initially.  Improved to 1.2.  Patient procalcitonin noted to 1.93.  WBC is 13.2 which could be due to steroids.  Continue to monitor off of antibiotics at this time.  Acute systolic CHF/cardiac arrest EF is around 20%.  Patient's cardiac arrest likely precipitated by hypoxia in the setting of congestive heart failure.  He had to be resuscitated twice.  He was given epinephrine bicarbonate and calcium chloride.  He is on amiodarone infusion.  He briefly required Levophed, but now that is been weaned off. No ACE inhibitor  is due to renal failure, shock.  Patient's cardiac status was discussed by Dr. [place] Pink with Dr. Aundra Dubin with the heart failure team.  He recommended venous blood gas.  This was done.  Saturation on that gas was 42%.  Dobutamine was recommended at 2.5 mcg/kg/min.  Since that time, patient's hemodynamics have improved.  Dobutamine has been weaned off and blood pressure is elevated.  Will give a trial of intravenous Lasix.  Acute renal failure Baseline renal function is not known.  Patient could have CKD based on his history however this is unspecified.  Initially there was concern for hypovolemia.  Patient was given IV fluids with improvement in renal function.  However he went into cardiac arrest as discussed above.  Creatinine is continues to decline today.  Urine output has been poor..  Will attempt a trial of Lasix today.  Transaminitis Significant bump in his AST and ALT are due to shock.  Trending down.  Continue to monitor.  History of diabetes mellitus type 2, uncontrolled with hyperglycemia/DKA Patient had elevated anion gap and was noted to have ketones in his urine.  Likely was in DKA as he did have nausea vomiting for a few days.  Patient was continued on IV insulin infusion.  His bicarbonate has improved.  His anion gap closed.  He was transitioned to long-acting insulin.  Once he was intubated he was switched back to IV insulin.  A1c is 13.8.  Since that time, he is converted back to subcutaneous insulin.  Blood sugars are currently high and basal insulin will be adjusted.Marland Kitchen   History of coronary artery disease status post CABG EKG shows nonspecific T wave changes.  No old EKG available for comparison.  High-sensitivity troponin levels not significantly elevated.  And then of course he had cardiac arrest as discussed above.  Continue aspirin.  Will use beta-blockade once his hemodynamics have improved.     Essential hypertension Currently stable, recently weaned off pressors  Obesity    Estimated body mass index is 35.31 kg/m as calculated from the following:   Height as of this encounter: 5' (1.524 m).   Weight as of this encounter: 82 kg.  Goals of care Considering that patient has had 2 episodes of cardiac arrest requiring CPR, and with his significant CHF with a EF only of 20% the chances of meaningful recovery if he has another episode of cardiac arrest is extremely low.  Discussed with Dr. Valeta Harms with critical care medicine who agrees.  We will make the patient DNR.  Will also discuss this issue with family members today.  Patient's daughter was updated.  Told about patient's worsening renal function as well.  She will discuss with her siblings.  Palliative medicine will be consulted.   DVT Prophylaxis: heparin PUD Prophylaxis: Famotidine Code Status: DNR Family Communication: discussed with daughter and multiple family members over the phone regarding poor prognosis. 7/30 Disposition Plan: Continue ICU monitoring for now   Medications:  Scheduled:  amiodarone  150 mg  Intravenous Once   artificial tears  1 application Both Eyes Q6P   [START ON 06/06/2019] aspirin  81 mg Oral Daily   chlorhexidine gluconate (MEDLINE KIT)  15 mL Mouth Rinse BID   Chlorhexidine Gluconate Cloth  6 each Topical Q0600   furosemide  80 mg Intravenous Q6H   heparin injection (subcutaneous)  7,500 Units Subcutaneous Q8H   insulin aspart  0-15 Units Subcutaneous Q4H   insulin aspart  7 Units Subcutaneous Q4H   insulin detemir  10 Units Subcutaneous Q12H   mouth rinse  15 mL Mouth Rinse 10 times per day   methylPREDNISolone (SOLU-MEDROL) injection  40 mg Intravenous Q12H   multivitamin with minerals  1 tablet Oral Daily   potassium chloride  20 mEq Per Tube BID   Continuous:  albumin human Stopped (06/05/19 1346)   amiodarone 30 mg/hr (06/05/19 1400)   dextrose     dextrose 5 % and 0.45% NaCl 75 mL/hr at 06/05/19 1400   DOBUTamine Stopped (06/05/19 0316)    famotidine (PEPCID) IV Stopped (06/05/19 6195)   feeding supplement (VITAL AF 1.2 CAL) 1,000 mL (06/05/19 1017)   fentaNYL infusion INTRAVENOUS Stopped (06/05/19 0600)   midazolam Stopped (06/05/19 0400)   norepinephrine (LEVOPHED) Adult infusion Stopped (06/04/19 2036)   remdesivir 100 mg in NS 250 mL Stopped (06/05/19 1245)   KDT:OIZTIWPY, dextrose, fentaNYL, hydrALAZINE, midazolam   Objective:  Vital Signs  Vitals:   06/05/19 1125 06/05/19 1200 06/05/19 1300 06/05/19 1400  BP: (!) 150/63 137/66 135/66 (!) 143/70  Pulse: 60 62 61 63  Resp: (!) 35 (!) 35 (!) 35 (!) 35  Temp:  (!) 94.5 F (34.7 C)    TempSrc:  Oral    SpO2: 91% (!) 89% 99% 92%  Weight:      Height:        Intake/Output Summary (Last 24 hours) at 06/05/2019 1442 Last data filed at 06/05/2019 1400 Gross per 24 hour  Intake 5565.98 ml  Output 400 ml  Net 5165.98 ml   Filed Weights   05/20/2019 0801 06/05/19 0500  Weight: 72.6 kg 82 kg    General exam: unresponsive on vent Respiratory system: crackles at bases. Respiratory effort normal. Cardiovascular system:RRR. No murmurs, rubs, gallops. Gastrointestinal system: Abdomen is nondistended, soft and nontender. No organomegaly or masses felt. Normal bowel sounds heard. Central nervous system: unresponsive, does not withdraw to pain, no gag reflex/cough with manipulation of ETT Extremities: 1+ edema bilaterally Skin: No rashes, lesions or ulcers Psychiatry: unresponsive     Lab Results:  Data Reviewed: I have personally reviewed following labs and imaging studies  CBC: Recent Labs  Lab 06/06/2019 0055 06/03/19 0550 06/03/19 1547  06/03/19 2025 06/04/19 0517 06/04/19 0530 06/04/19 1630 06/05/19 0325  WBC 6.1 6.3 7.2  --   --   --  13.2*  --  13.2*  NEUTROABS 5.6 5.5  --   --   --   --  11.7*  --  12.0*  HGB 13.4 12.2* 11.5*   < > 11.6* 12.2* 10.6* 9.5* 11.1*  HCT 39.7 36.7* 36.6*   < > 34.0* 36.0* 33.1* 28.0* 34.3*  MCV 89.2 89.3 96.6  --    --   --  92.5  --  92.7  PLT 115* 156 121*  --   --   --  175  --  156   < > = values in this interval not displayed.    Basic Metabolic Panel: Recent Labs  Lab 06/03/19 0550  06/03/19 1547  06/03/19 2045 06/04/19 0517 06/04/19 0530 06/04/19 1500 06/04/19 1630 06/04/19 1800 06/05/19 0325  NA 137 138   < > 140 141 142  --  139  --  139  K 3.9 3.2*   < > 4.7 4.5 4.6  --  3.6  --  3.8  CL 105 102  --  103  --  104  --   --   --  98  CO2 21* 17*  --  27  --  29  --   --   --  28  GLUCOSE 174* 555*  --  390*  --  115*  --   --   --  271*  BUN 55* 57*  --  62*  --  65*  --   --   --  77*  CREATININE 1.49* 1.92*  --  2.01*  --  2.42*  --   --   --  3.28*  CALCIUM 7.6* 9.1  --  7.5*  --  6.9*  --   --   --  6.5*  MG  --   --   --  2.3  --   --  2.1  --  2.0 2.2  PHOS  --   --   --  8.3*  --   --  6.5*  --  6.3* 6.4*   < > = values in this interval not displayed.    GFR: Estimated Creatinine Clearance: 19.9 mL/min (A) (by C-G formula based on SCr of 3.28 mg/dL (H)).  Liver Function Tests: Recent Labs  Lab 05/16/2019 0055 06/03/19 0550 06/03/19 1547 06/04/19 0530 06/05/19 0325  AST 109* 123* 249* 501* 235*  ALT 34 38 176* 198* 158*  ALKPHOS 136* 144* 145* 181* 195*  BILITOT 0.8 0.7 0.7 0.6 0.5  PROT 7.0 6.9 5.5* 5.2* 5.2*  ALBUMIN 2.9* 2.9* 2.3* 2.2* 2.3*     Coagulation Profile: Recent Labs  Lab 05/13/2019 0055 06/03/19 1547  INR 1.0 1.2     CBG: Recent Labs  Lab 06/04/19 1930 06/05/19 0025 06/05/19 0438 06/05/19 0800 06/05/19 1148  GLUCAP 193* 209* 245* 314* 346*    Anemia Panel: Recent Labs    06/04/19 0547 06/05/19 0325  FERRITIN 7,301* 7,410*    Recent Results (from the past 240 hour(s))  Culture, blood (Routine x 2)     Status: None (Preliminary result)   Collection Time: 06/01/2019 12:55 AM   Specimen: BLOOD  Result Value Ref Range Status   Specimen Description   Final    BLOOD LEFT ANTECUBITAL Performed at [facility],  [place] [address], [place], [place] [address]    Special Requests   Final    BOTTLES DRAWN AEROBIC AND ANAEROBIC Blood Culture results may not be optimal due to an excessive volume of blood received in culture bottles Performed at [place] [address] [place], [place] [address]    Culture   Final    NO GROWTH 3 DAYS Performed at [facility], [place] [address], [place], [place] [address]    Report Status PENDING  Incomplete  Culture, blood (Routine x 2)     Status: None (Preliminary result)   Collection Time: 05/23/2019 12:59 AM   Specimen: BLOOD RIGHT FOREARM  Result Value Ref Range Status   Specimen Description   Final    BLOOD RIGHT FOREARM Performed at [facility], [place] [address], [place],  [address]  Special Requests   Final    BOTTLES DRAWN AEROBIC AND ANAEROBIC Blood Culture adequate volume Performed at [place] [address], [place], [place] [address]    Culture   Final    NO GROWTH 3 DAYS Performed at [facility], [place] [address] [place], [place] [address]    Report Status PENDING  Incomplete  SARS Coronavirus 2 (CEPHEID - Performed in [place] hospital lab), Hosp Order     Status: Abnormal   Collection Time: 05/10/2019  3:52 AM   Specimen: Nasopharyngeal Swab  Result Value Ref Range Status   SARS Coronavirus 2 POSITIVE (A) NEGATIVE Final    Comment: RESULT CALLED TO, READ BACK BY AND VERIFIED WITH: S.BINGHAM RN AT 0981 ON 05/18/2019 BY S.VANHOORNE (NOTE) If result is NEGATIVE SARS-CoV-2 target nucleic acids are NOT DETECTED. The SARS-CoV-2 RNA is generally detectable in upper and lower  respiratory specimens during the acute phase of infection. The lowest  concentration of SARS-CoV-2 viral copies this assay can detect is 250  copies / mL. A negative result does not preclude SARS-CoV-2 infection  and should not be used as the sole basis for treatment or other  patient management decisions.   A negative result may occur with  improper specimen collection / handling, submission of specimen other  than nasopharyngeal swab, presence of viral mutation(s) within the  areas targeted by this assay, and inadequate number of viral copies  (<250 copies / mL). A negative result must be combined with clinical  observations, patient history, and epidemiological information. If result is POSITIVE SARS-CoV-2 target nucleic acids are  DETECTED. The SARS-CoV-2 RNA is generally detectable in upper and lower  respiratory specimens during the acute phase of infection.  Positive  results are indicative of active infection with SARS-CoV-2.  Clinical  correlation with patient history and other diagnostic information is  necessary to determine patient infection status.  Positive results do  not rule out bacterial infection or co-infection with other viruses. If result is PRESUMPTIVE POSTIVE SARS-CoV-2 nucleic acids MAY BE PRESENT.   A presumptive positive result was obtained on the submitted specimen  and confirmed on repeat testing.  While 2019 novel coronavirus  (SARS-CoV-2) nucleic acids may be present in the submitted sample  additional confirmatory testing may be necessary for epidemiological  and / or clinical management purposes  to differentiate between  SARS-CoV-2 and other Sarbecovirus currently known to infect humans.  If clinically indicated additional testing with an alternate test  methodology  [id number]) is advised. The SARS-CoV-2 RNA is generally  detectable in upper and lower respiratory specimens during the acute  phase of infection. The expected result is Negative. Fact Sheet for Patients:  StrictlyIdeas.no Fact Sheet for Healthcare Providers: BankingDealers.co.za This test is not yet approved or cleared by the [place] FDA and has been authorized for detection and/or diagnosis of SARS-CoV-2 by FDA under an Emergency Use  Authorization (EUA).  This EUA will remain in effect (meaning this test can be used) for the duration of the COVID-19 declaration under Section 564(b)(1) of the Act, 21 U.S.C. section 360bbb-3(b)(1), unless the authorization is terminated or revoked sooner. Performed at [facility], [place] [address], [place], [place] [address]   MRSA PCR Screening     Status: None   Collection Time: 06/01/2019  4:54 PM   Specimen: Nasal Mucosa; Nasopharyngeal  Result Value Ref Range Status   MRSA by PCR NEGATIVE NEGATIVE Final    Comment:  The GeneXpert MRSA Assay (FDA approved for NASAL specimens only), is one component of a comprehensive MRSA colonization surveillance program. It is not intended to diagnose MRSA infection nor to guide or monitor treatment for MRSA infections. Performed at [facility], [place] [address] [place], [place] [address]       Radiology Studies: Ct Head Wo Contrast  Result Date: 06/05/2019 CLINICAL DATA:  Cardiac arrest EXAM: CT HEAD WITHOUT CONTRAST TECHNIQUE: Contiguous axial images were obtained from the base of the skull through the vertex without intravenous contrast. COMPARISON:  None. FINDINGS: Brain: No evidence of acute infarction, hemorrhage, hydrocephalus, extra-axial collection or mass lesion/mass effect. Vascular: No hyperdense vessel or unexpected calcification. Skull: Normal. Negative for fracture or focal lesion. Sinuses/Orbits: No acute finding. Other: None. IMPRESSION: No acute intracranial pathology. Electronically Signed   By: Eddie Candle M.D.   On: 06/05/2019 13:45   Dg Chest Port 1 View  Result Date: 06/04/2019 CLINICAL DATA:  ARDS. EXAM: PORTABLE CHEST 1 VIEW COMPARISON:  06/03/2019 FINDINGS: Endotracheal tube is 3.6 cm above the carina. Right jugular central line in the upper SVC region. Again noted is a dual chamber cardiac pacemaker. Nasogastric tube in the left upper abdomen with the tip near the gastric  fundus region. Again noted are bilateral parenchymal lung densities. Slightly improved aeration in both lungs compared to the recent comparison examination. Negative for a pneumothorax. IMPRESSION: 1. Improved aeration in both lungs with persistent bilateral parenchymal lung disease. 2. Support apparatuses appear to be appropriately positioned. Electronically Signed   By: Markus Daft M.D.   On: 06/04/2019 11:41   Dg Chest Port 1 View  Result Date: 06/03/2019 CLINICAL DATA:  Intubated EXAM: PORTABLE CHEST 1 VIEW COMPARISON:  06/03/2019 FINDINGS: Diffuse bilateral interstitial and patchy alveolar airspace opacities which are worsening. No pleural effusion or pneumothorax. Stable cardiomediastinal silhouette. Endotracheal tube with the tip extending into the left mainstem bronchus. Recommend retracting the endotracheal tube approximately 4 cm. Nasogastric tube coursing below the diaphragm. Right jugular central venous catheter with the tip projecting over the SVC. Dual lead cardiac pacemaker again noted. IMPRESSION: 1. Worsening diffuse bilateral interstitial and patchy alveolar airspace opacities which may reflect worsening pulmonary edema versus multilobar pneumonia. 2. Endotracheal tube with the tip extending into the left mainstem bronchus. Recommend retracting the endotracheal tube approximately 4 cm. Electronically Signed   By: Kathreen Devoid   On: 06/03/2019 16:26       LOS: 3 days   Kathie Dike  [facility] Pager on [email or web address]  06/05/2019, 2:42 PM

## 2019-06-05 NOTE — Progress Notes (Signed)
ANTICOAGULATION CONSULT NOTE - Follow Up Consult  Pharmacy Consult for Heparin IV Indication: chest pain/ACS  No Known Allergies  Patient Measurements: Height: 5' (152.4 cm) Weight: 180 lb 12.8 oz (82 kg) IBW/kg (Calculated) : 50 Heparin Dosing Weight: 65.5 kg  Vital Signs: Temp: 97.7 F (36.5 C) (07/30 0000) Temp Source: Oral (07/30 0000) BP: 144/77 (07/30 0400) Pulse Rate: 59 (07/30 0400)  Labs: Recent Labs    05/10/2019 2005  06/03/19 0550 06/03/19 1547  06/03/19 2045 06/04/19 0030  06/04/19 0530 06/04/19 1318 06/04/19 1630 06/05/19 0030 06/05/19 0325  HGB  --    < > 12.2* 11.5*   < >  --   --    < > 10.6*  --  9.5*  --  11.1*  HCT  --    < > 36.7* 36.6*   < >  --   --    < > 33.1*  --  28.0*  --  34.3*  PLT  --    < > 156 121*  --   --   --   --  175  --   --   --  156  APTT  --   --   --  74*  --   --   --   --   --   --   --   --   --   LABPROT  --   --   --  14.7  --   --   --   --   --   --   --   --   --   INR  --   --   --  1.2  --   --   --   --   --   --   --   --   --   HEPARINUNFRC  --   --   --   --   --   --  1.34*  --   --  0.96*  --  1.06*  --   CREATININE 1.56*  --  1.49* 1.92*  --  2.01*  --   --  2.42*  --   --   --   --   TROPONINIHS 127*  --  97* 102*  --   --   --   --   --   --   --   --   --    < > = values in this interval not displayed.    Estimated Creatinine Clearance: 27 mL/min (A) (by C-G formula based on SCr of 2.42 mg/dL (H)).   Medications:  Infusions:  . amiodarone 30 mg/hr (06/05/19 0414)  . dextrose    . dextrose 5 % and 0.45% NaCl 75 mL/hr at 06/04/19 0410  . DOBUTamine Stopped (06/05/19 0316)  . famotidine (PEPCID) IV 20 mg (06/04/19 1015)  . feeding supplement (VITAL AF 1.2 CAL) 1,000 mL (06/05/19 0129)  . fentaNYL infusion INTRAVENOUS Stopped (06/05/19 0600)  . heparin 500 Units/hr (06/04/19 2000)  . midazolam Stopped (06/05/19 0400)  . norepinephrine (LEVOPHED) Adult infusion Stopped (06/04/19 2036)  . remdesivir 100  mg in NS 250 mL 100 mg (06/04/19 1226)    Assessment: 60 yoM admitted on 7/27 with COVID-19 pneumonia.  PMH significant for CAD, DM, HLD, HTN, CABG, Pacemaker.  He was initially started on Heparin SQ for VTE prophylaxis, then transitioned to Lovenox prophylaxis as renal function improved.  Now, s/p cardiac arrest, pharmacy is consulted to dose  Heparin for ACS. Last Lovenox dose 40mg  given 7/28 at ~1300  Today, 06/05/2019:  Heparin levels remain supratherapeutic despite heparin infusion decreased to 500 units/hr (7.6 units/kg/hr)  0030 HL: 1.06, increased despite holding and rate reduction.  Pharmacist questions reliability of level (drawn from HL line).  Recheck ordered.  0325 HL: increased further to 1.42, should be reliable given it was drawn with care from the A-line and the heparin is running through the left peripheral AC line.  SCr up to 3.28  CBC:  Hgb 11.1 remains low/stable, Plt 156  No bleeding or complications reported.  Goal of Therapy:  Heparin level 0.3-0.7 units/ml Monitor platelets by anticoagulation protocol: Yes   Plan:   HOLD heparin infusion at 0930  Recheck STAT heparin level at 1030, prior to resuming.  Resume low dose heparin IV infusion 300 units/hr  Heparin level 8 hours after rate change  Daily heparin level and CBC.  Continue to monitor H&H and platelets  Gretta Arab PharmD, BCPS Clinical pharmacist phone 7am- 5pm: [phone] 06/05/2019 7:23 AM

## 2019-06-05 NOTE — Progress Notes (Signed)
Inpatient Diabetes Program Recommendations  AACE/ADA: New Consensus Statement on Inpatient Glycemic Control (2015)  Target Ranges:  Prepandial:   less than 140 mg/dL      Peak postprandial:   less than 180 mg/dL (1-2 hours)      Critically ill patients:  140 - 180 mg/dL   Lab Results  Component Value Date   GLUCAP 314 (H) 06/05/2019   HGBA1C 13.8 (H) 06/03/2019    Review of Glycemic Control  Results for TIGRAN, HAYNIE (MRN [id number]) as of 06/05/2019 10:19  Ref. Range 06/04/2019 11:06 06/04/2019 12:12 06/04/2019 12:18 06/04/2019 13:07 06/04/2019 14:03 06/04/2019 15:03 06/04/2019 16:20 06/04/2019 19:30 06/05/2019 04:38 06/05/2019 08:00  Glucose-Capillary Latest Ref Range: 70 - 99 mg/dL 265 (H) 52 (L) 246 (H) 222 (H) 98 226 (H) 126 (H) 193 (H) 245 (H) 314 (H)   Diabetes history: DM 2 Outpatient Diabetes medications:  Lantus 43-45 units daily Current orders for Inpatient glycemic control:  Levemir 10 units bid, Novolog 0-15 units Q4, Novolog 4 units Tube Feed Coverage Q4  Inpatient Diabetes Program Recommendations:    Renal function increased  Increase Levemir 15 units bid    Also consider increasing Tube Feed Coverage to 6 units Q4    Thanks,  Tama Headings RN, MSN, BC-ADM Inpatient Diabetes Coordinator Team Pager [phone] (8a-5p)

## 2019-06-05 NOTE — Progress Notes (Signed)
Patients daughter called and updated on patients condition.

## 2019-06-05 NOTE — Progress Notes (Signed)
CRITICAL VALUE ALERT  Critical Value:  Glucose 505 and calcium 6.1  Date & Time Notied:  20:09 06/05/2019  Provider Notified: Dr Olevia Bowens  Orders Received/Actions taken: order received for calcium gluc.  Patient was just treated at [facility] for CBG 377 with 29units novolog and will get levemir tonight

## 2019-06-05 NOTE — Progress Notes (Addendum)
NAME:  Robert Mcdaniel, MRN:  [id number], DOB:  Jun 18, 1951, LOS: 3 ADMISSION DATE:  05/22/2019, CONSULTATION DATE:  7/27 REFERRING MD:  Kim/Krishnan, CHIEF COMPLAINT:  Dyspnea   Brief History   68 y/o male admitted on 7/20 with ARDS from COVID 19 pneumonia.  Past Medical History  Hypertension Hyperlipidemia DM2 CAD  Significant Hospital Events   7/27 admission to [facility] 7/28 cardiac arrest, off o2, PEA, 6 mins  7/30 off sedation, no response to painful stimuli   Consults:  PCCM  Procedures:  7/28 CPR - 2 rounds EPI, 1 amp bicarb, ROSC  7/28 Intubation  7/28 CVC RIJ   Significant Diagnostic Tests:  7/30 CT head - pending  7/30 EEG - pending   Micro Data:  7/27 SARS-COV-2 > positive  Antimicrobials/OOVID Rx:  7/27 remdesivir>  7/27 decadron>  7/27 tocilizumab>   Interim history/subjective:   Patient remains intubated sedated on mechanical ventilation.  Critically ill within the intensive care unit off of sedation since this morning with no significant neurologic response.  RA SS remains -5  Objective   Blood pressure 135/65, pulse (!) 58, temperature (!) 94.5 F (34.7 C), temperature source Oral, resp. rate (!) 35, height 5' (1.524 m), weight 82 kg, SpO2 94 %. CVP:  [9 mmHg-25 mmHg] 10 mmHg  Vent Mode: PRVC FiO2 (%):  [50 %-80 %] 80 % Set Rate:  [35 bmp] 35 bmp Vt Set:  [400 mL] 400 mL PEEP:  [10 cmH20] 10 cmH20 Plateau Pressure:  [27 cmH20-30 cmH20] 29 cmH20   Intake/Output Summary (Last 24 hours) at 06/05/2019 1137 Last data filed at 06/05/2019 1000 Gross per 24 hour  Intake 5204.46 ml  Output 420 ml  Net 4784.46 ml   Filed Weights   05/15/2019 0801 06/05/19 0500  Weight: 72.6 kg 82 kg    Examination:  General: Elderly male, intubated on mechanical life support off sedation with no response to environment HENT: NCAT, pupils 4 mm sluggish response to light to 3 mm, significant conjunctival edema and scleral edema PULM: Bilateral ventilated breath sounds CV:  Regular rate and rhythm, S1-S2 GI: Bowel sounds present, soft nontender nondistended MSK: Normal bulk and tone Neuro: No response to orbital pressure, trap squeeze, sternal rub, no withdrawal to painful stimuli, no cough gag with manipulation of tube.  Neurologic exam at 2 separate times approximately 9 AM and 11:30 AM with no change.  Had been off sedation since early this morning.  Chest x-ray 06/03/2019: Diffuse bilateral interstitial alveolar opacities as seen prior consistent with multi lobar pneumonia and ARDS. The patient's images have been independently reviewed by me.     Resolved Hospital Problem list     Assessment & Plan:   ARDS due to SARS-COV2 pneumonia Continue mechanical ventilation per ARDS protocol Target TVol 6-8cc/kgIBW Target Plateau Pressure < 30cm H20 Target driving pressure less than 15 cm of water Target PaO2 55-65: titrate PEEP/FiO2 per protocol As long as PaO2 to FiO2 ratio is less than 1:150 position in prone position for 16 hours a day Check CVP daily if CVL in place Target CVP less than 4, diurese as necessary Ventilator associated pneumonia prevention protocol   Complete remdesivir X 5 days  Decadron 6 mg p.o. daily x10 days Status post tocolizumab dosing   Shock, resolved  Acute on chronic systolic heart failure Cardiomyopathy Status post cardiac arrest, PEA, with return of spontaneous circulation, approximately 6 minutes Echocardiogram 06/03/2019: Severely depressed left ventricular ejection fraction 20% At this point the patient has been  weaned from all vasopressors and inotropes. Dobutamine has been stopped.  Pressures have remained stable.  Still has little urine output.  His cumulative fluid balance is positive. We will plan for diuresis today. 80 mg Lasix every 6 hours x4 doses Potassium supplementation We will recheck BMP this evening.  Sedation needs while on mechanical ventilation Sedation has been held completely this morning.  Unfortunately he has no response to painful stimuli. He was checked initially on rounds and then rechecked again 3 hours later with no response to painful stimuli. Continue to hold sedation  Acute metabolic encephalopathy There is some concern for anoxic brain injury secondary to severe hypoxemia leading to PEA cardiac arrest. We will obtain a CT scan of the head today. If there is not any significant gross abnormality from the CT of the head since he stable to go down for today we can consider MRI of the brain or EEG for further testing. Continue to hold sedating medications  CAD Stop home dose metoprolol as patient was on vasopressors Continue to hold this for the time being Once euvolemic consider restarting  Acute renal failure AKI: improved with IVF Discussed care with nephrology today. We will give patient trial of Lasix to see if he has any urine output With depressed EF and slowly climbing serum creatinine 1 can consider cardiorenal type syndrome on top of shock state with ATN for potential etiologies. Patient has no urgent need for dialysis. Would like to hold off on initiation of CVVHD until we have some clear neurologic recovery post arrest even though he was only down for a few moments, approximately 6 minutes  Hyperglycemia/DKA Per TR[facility]  Goals of care: Overall prognosis is poor.  We will continue to update family   Best practice:  Diet: regular diet Pain/Anxiety/Delirium protocol (if indicated): n/a VAP protocol (if indicated): n/a DVT prophylaxis: Subcu heparin, COVID protocol GI prophylaxis: n/a Glucose control: SSI, Glargine per protocol Mobility: bed rest Code Status: full Family Communication: Per TRH Disposition: remain in ICU  Labs   CBC: Recent Labs  Lab 05/15/2019 0055 06/03/19 0550 06/03/19 1547  06/03/19 2025 06/04/19 0517 06/04/19 0530 06/04/19 1630 06/05/19 0325  WBC 6.1 6.3 7.2  --   --   --  13.2*  --  13.2*  NEUTROABS 5.6 5.5  --   --    --   --  11.7*  --  12.0*  HGB 13.4 12.2* 11.5*   < > 11.6* 12.2* 10.6* 9.5* 11.1*  HCT 39.7 36.7* 36.6*   < > 34.0* 36.0* 33.1* 28.0* 34.3*  MCV 89.2 89.3 96.6  --   --   --  92.5  --  92.7  PLT 115* 156 121*  --   --   --  175  --  156   < > = values in this interval not displayed.    Basic Metabolic Panel: Recent Labs  Lab 06/03/19 0550 06/03/19 1547  06/03/19 2045 06/04/19 0517 06/04/19 0530 06/04/19 1500 06/04/19 1630 06/04/19 1800 06/05/19 0325  NA 137 138   < > 140 141 142  --  139  --  139  K 3.9 3.2*   < > 4.7 4.5 4.6  --  3.6  --  3.8  CL 105 102  --  103  --  104  --   --   --  98  CO2 21* 17*  --  27  --  29  --   --   --  28  GLUCOSE 174* 555*  --  390*  --  115*  --   --   --  271*  BUN 55* 57*  --  62*  --  65*  --   --   --  77*  CREATININE 1.49* 1.92*  --  2.01*  --  2.42*  --   --   --  3.28*  CALCIUM 7.6* 9.1  --  7.5*  --  6.9*  --   --   --  6.5*  MG  --   --   --  2.3  --   --  2.1  --  2.0 2.2  PHOS  --   --   --  8.3*  --   --  6.5*  --  6.3* 6.4*   < > = values in this interval not displayed.   GFR: Estimated Creatinine Clearance: 19.9 mL/min (A) (by C-G formula based on SCr of 3.28 mg/dL (H)). Recent Labs  Lab 05/12/2019 0055 05/18/2019 0311 05/28/2019 0413 06/03/19 0550 06/03/19 1547 06/03/19 2045 06/04/19 0530 06/05/19 0325  PROCALCITON  --   --  0.72 0.40  --   --  1.93  --   WBC 6.1  --   --  6.3 7.2  --  13.2* 13.2*  LATICACIDVEN 2.3* 1.2  --   --  >11.0* 1.6  --   --     Liver Function Tests: Recent Labs  Lab 05/18/2019 0055 06/03/19 0550 06/03/19 1547 06/04/19 0530 06/05/19 0325  AST 109* 123* 249* 501* 235*  ALT 34 38 176* 198* 158*  ALKPHOS 136* 144* 145* 181* 195*  BILITOT 0.8 0.7 0.7 0.6 0.5  PROT 7.0 6.9 5.5* 5.2* 5.2*  ALBUMIN 2.9* 2.9* 2.3* 2.2* 2.3*   No results for input(s): LIPASE, AMYLASE in the last 168 hours. No results for input(s): AMMONIA in the last 168 hours.  ABG    Component Value Date/Time   PHART  7.267 (L) 06/04/2019 0517   PCO2ART 68.8 (HH) 06/04/2019 0517   PO2ART 98.0 06/04/2019 0517   HCO3 31.1 (H) 06/04/2019 1630   TCO2 33 (H) 06/04/2019 1630   ACIDBASEDEF 1.0 06/03/2019 2025   O2SAT 42.0 06/04/2019 1630     Coagulation Profile: Recent Labs  Lab 05/31/2019 0055 06/03/19 1547  INR 1.0 1.2    Cardiac Enzymes: No results for input(s): CKTOTAL, CKMB, CKMBINDEX, TROPONINI in the last 168 hours.  HbA1C: Hgb A1c MFr Bld  Date/Time Value Ref Range Status  06/03/2019 05:50 AM 13.8 (H) 4.8 - 5.6 % Final    Comment:    (NOTE) Pre diabetes:          5.7%-6.4% Diabetes:              >6.4% Glycemic control for   <7.0% adults with diabetes     CBG: Recent Labs  Lab 06/04/19 1620 06/04/19 1930 06/05/19 0025 06/05/19 0438 06/05/19 0800  GLUCAP 126* 193* 209* 245* 314*    This patient is critically ill with multiple organ system failure; which, requires frequent high complexity decision making, assessment, support, evaluation, and titration of therapies. This was completed through the application of advanced monitoring technologies and extensive interpretation of multiple databases. During this encounter critical care time was devoted to patient care services described in this note for 34 minutes.   Garner Nash, DO [facility] 06/05/2019 11:38 AM  Personal pager: [phone] If unanswered, please page CCM On-call: [phone]

## 2019-06-05 NOTE — Progress Notes (Signed)
Patient transported to CT and back on current vent settings with FiO2 100%.  No complications noted.

## 2019-06-05 NOTE — Progress Notes (Signed)
Pt's daughter [name] called. Updated of pt condition. Pt's daughter appreciative of update

## 2019-06-05 NOTE — Progress Notes (Signed)
Pt's daughter called unit. Updated of pt condition. Pt's daughter appreciative of update.

## 2019-06-05 NOTE — Consult Note (Signed)
NEURO HOSPITALIST TELE-CONSULT NOTE   Requestig physician: Dr. Roderic Palau  Reason for Consult: Unarousable after discontinuation of sedation  History obtained from:   Chart    HPI:                                                                                                                                          Robert Mcdaniel is an 68 y.o. male who was admitted on 7/27 with dry cough and dyspnea, found to be COVID positive. On 7/28 the patient experienced cardiac arrest and was resuscitated twice. Has an EF of 20% and is with acute renal failure with poor UOP and rising BUN and Cr. He is on the ventilator and has been off sedation since the early AM, but has been unresponsive. Per RN, fluid input has significantly exceeded urine output and patient exhibits diffuse edema, consistent with third spacing.   Past Medical History:  Diagnosis Date  . CAD (coronary artery disease)   . Diabetes ([place])   . Hyperlipidemia   . Hypertension     Past Surgical History:  Procedure Laterality Date  . CORONARY ARTERY BYPASS GRAFT    . PACEMAKER INSERTION      Family History  Problem Relation Age of Onset  . Heart attack Father              Social History:  reports that he has quit smoking. He has never used smokeless tobacco. He reports that he does not drink alcohol or use drugs.  No Known Allergies  MEDICATIONS:                                                                                                                     Scheduled: . amiodarone  150 mg Intravenous Once  . artificial tears  1 application Both Eyes I5W  . [START ON 06/06/2019] aspirin  81 mg Oral Daily  . chlorhexidine gluconate (MEDLINE KIT)  15 mL Mouth Rinse BID  . Chlorhexidine Gluconate Cloth  6 each Topical Q0600  . furosemide  80 mg Intravenous Q6H  . heparin injection (subcutaneous)  7,500 Units Subcutaneous Q8H  . insulin aspart  0-20 Units Subcutaneous Q4H  . insulin aspart  9 Units  Subcutaneous Q4H  . insulin detemir  15 Units Subcutaneous  Q12H  . mouth rinse  15 mL Mouth Rinse 10 times per day  . methylPREDNISolone (SOLU-MEDROL) injection  40 mg Intravenous Q12H  . multivitamin with minerals  1 tablet Oral Daily  . potassium chloride  20 mEq Per Tube BID   Continuous: . albumin human Stopped (06/05/19 1346)  . amiodarone 30 mg/hr (06/05/19 1600)  . dextrose    . DOBUTamine Stopped (06/05/19 0316)  . famotidine (PEPCID) IV Stopped (06/05/19 7414)  . feeding supplement (VITAL AF 1.2 CAL) 1,000 mL (06/05/19 1017)  . fentaNYL infusion INTRAVENOUS Stopped (06/05/19 0600)  . midazolam Stopped (06/05/19 0400)  . norepinephrine (LEVOPHED) Adult infusion Stopped (06/04/19 2036)  . remdesivir 100 mg in NS 250 mL Stopped (06/05/19 1245)     ROS:                                                                                                                                       Unable to obtain due to AMS.    Blood pressure (!) 152/70, pulse 65, temperature (!) 97.5 F (36.4 C), temperature source Oral, resp. rate (!) 30, height 5' (1.524 m), weight 82 kg, SpO2 (!) 85 %.   General Examination:                                                                                                       Physical Exam  CV: RRR    Lungs- Intubated Extremities- Diffuse edema  Neurological Examination: Performed via teleconsultation camera with RN assistance Mental Status: No responses to any external stimuli. GCS 3T in the context of probable residual Versed sedation from third-spacing.  Cranial Nerves: II: Pupils 3 mm bilaterally and sluggishly reactive   III,IV, VI: Eyes conjugately at the midline without forced gaze deviation V,VII: Face flaccidly symmetric VIII: No response to voice or loud clap IX,X: Intubated XI: Unable to assess XII: Unable to assess Motor/Sensory: Flaccid tone x 4 with no movement to any noxious stimuli, including sternal rub and plantar  stimulation. No asymmetry.  Deep Tendon Reflexes: 0 patellae bilaterally  Cerebellar/Gait: Unable to assess   Lab Results: Basic Metabolic Panel: Recent Labs  Lab 06/03/19 0550 06/03/19 1547  06/03/19 2045 06/04/19 0517 06/04/19 0530 06/04/19 1500 06/04/19 1630 06/04/19 1800 06/05/19 0325 06/05/19 1717  NA 137 138   < > 140 141 142  --  139  --  139  --   K 3.9 3.2*   < > 4.7 4.5 4.6  --  3.6  --  3.8  --   CL 105 102  --  103  --  104  --   --   --  98  --   CO2 21* 17*  --  27  --  29  --   --   --  28  --   GLUCOSE 174* 555*  --  390*  --  115*  --   --   --  271*  --   BUN 55* 57*  --  62*  --  65*  --   --   --  77*  --   CREATININE 1.49* 1.92*  --  2.01*  --  2.42*  --   --   --  3.28*  --   CALCIUM 7.6* 9.1  --  7.5*  --  6.9*  --   --   --  6.5*  --   MG  --   --   --  2.3  --   --  2.1  --  2.0 2.2 2.3  PHOS  --   --   --  8.3*  --   --  6.5*  --  6.3* 6.4* 7.6*   < > = values in this interval not displayed.    CBC: Recent Labs  Lab 05/24/2019 0055 06/03/19 0550 06/03/19 1547  06/03/19 2025 06/04/19 0517 06/04/19 0530 06/04/19 1630 06/05/19 0325  WBC 6.1 6.3 7.2  --   --   --  13.2*  --  13.2*  NEUTROABS 5.6 5.5  --   --   --   --  11.7*  --  12.0*  HGB 13.4 12.2* 11.5*   < > 11.6* 12.2* 10.6* 9.5* 11.1*  HCT 39.7 36.7* 36.6*   < > 34.0* 36.0* 33.1* 28.0* 34.3*  MCV 89.2 89.3 96.6  --   --   --  92.5  --  92.7  PLT 115* 156 121*  --   --   --  175  --  156   < > = values in this interval not displayed.    Cardiac Enzymes: No results for input(s): CKTOTAL, CKMB, CKMBINDEX, TROPONINI in the last 168 hours.  Lipid Panel: No results for input(s): CHOL, TRIG, HDL, CHOLHDL, VLDL, LDLCALC in the last 168 hours.  Imaging: Ct Head Wo Contrast  Result Date: 06/05/2019 CLINICAL DATA:  Cardiac arrest EXAM: CT HEAD WITHOUT CONTRAST TECHNIQUE: Contiguous axial images were obtained from the base of the skull through the vertex without intravenous contrast.  COMPARISON:  None. FINDINGS: Brain: No evidence of acute infarction, hemorrhage, hydrocephalus, extra-axial collection or mass lesion/mass effect. Vascular: No hyperdense vessel or unexpected calcification. Skull: Normal. Negative for fracture or focal lesion. Sinuses/Orbits: No acute finding. Other: None. IMPRESSION: No acute intracranial pathology. Electronically Signed   By: Eddie Candle M.D.   On: 06/05/2019 13:45   Dg Chest Port 1 View  Result Date: 06/04/2019 CLINICAL DATA:  ARDS. EXAM: PORTABLE CHEST 1 VIEW COMPARISON:  06/03/2019 FINDINGS: Endotracheal tube is 3.6 cm above the carina. Right jugular central line in the upper SVC region. Again noted is a dual chamber cardiac pacemaker. Nasogastric tube in the left upper abdomen with the tip near the gastric fundus region. Again noted are bilateral parenchymal lung densities. Slightly improved aeration in both lungs compared to the recent comparison examination. Negative for a pneumothorax. IMPRESSION: 1. Improved aeration in both lungs with persistent bilateral parenchymal lung disease. 2. Support apparatuses appear to be  appropriately positioned. Electronically Signed   By: Markus Daft M.D.   On: 06/04/2019 11:41    Assessment: 68 year old male with unresponsiveness following cardiac arrest on 7/28.  1. Exam reveals no responses to any external stimuli. GCS 3T in the context of probable residual Versed sedation from third-spacing.   2. Overall impression from exam is that the patient is heavily sedated. Severe anoxic brain injury is possible, but felt to be less likely as there is no myoclonus or posturing.   Recommendations: 1. Keep off sedation. It may take some time to clear if Versed is still in his system, given renal impairment.  2. Call Neurology if clinical seizure-like activity occurs or if he shows no signs of responsiveness to external stimuli by Saturday.  3. MRI brain if still no improvement by Saturday off all sedation.  4. Overall  clinical picture is not consistent with status epilepticus. Hold off on EEG for now.   Electronically signed: Dr. Kerney Elbe  06/05/2019, 6:47 PM

## 2019-06-05 NOTE — Progress Notes (Signed)
Assisted tele visit to patient with provider Dr Cheral Marker.  Maryelizabeth Rowan, RN

## 2019-06-06 ENCOUNTER — Inpatient Hospital Stay (HOSPITAL_COMMUNITY): Payer: HRSA Program

## 2019-06-06 DIAGNOSIS — Z515 Encounter for palliative care: Secondary | ICD-10-CM | POA: Diagnosis not present

## 2019-06-06 DIAGNOSIS — J80 Acute respiratory distress syndrome: Secondary | ICD-10-CM | POA: Diagnosis not present

## 2019-06-06 DIAGNOSIS — J9601 Acute respiratory failure with hypoxia: Secondary | ICD-10-CM | POA: Diagnosis not present

## 2019-06-06 DIAGNOSIS — U071 COVID-19: Secondary | ICD-10-CM | POA: Diagnosis not present

## 2019-06-06 DIAGNOSIS — Z7189 Other specified counseling: Secondary | ICD-10-CM | POA: Diagnosis not present

## 2019-06-06 DIAGNOSIS — Z9911 Dependence on respirator [ventilator] status: Secondary | ICD-10-CM | POA: Diagnosis not present

## 2019-06-06 DIAGNOSIS — N179 Acute kidney failure, unspecified: Secondary | ICD-10-CM | POA: Diagnosis not present

## 2019-06-06 DIAGNOSIS — E119 Type 2 diabetes mellitus without complications: Secondary | ICD-10-CM | POA: Diagnosis not present

## 2019-06-06 LAB — POCT I-STAT 7, (LYTES, BLD GAS, ICA,H+H)
Acid-base deficit: 3 mmol/L — ABNORMAL HIGH (ref 0.0–2.0)
Acid-base deficit: 3 mmol/L — ABNORMAL HIGH (ref 0.0–2.0)
Bicarbonate: 26.5 mmol/L (ref 20.0–28.0)
Bicarbonate: 25.4 mmol/L (ref 20.0–28.0)
Calcium, Ion: 0.93 mmol/L — ABNORMAL LOW (ref 1.15–1.40)
Calcium, Ion: 1.04 mmol/L — ABNORMAL LOW (ref 1.15–1.40)
HCT: 29 % — ABNORMAL LOW (ref 39.0–52.0)
HCT: 24 % — ABNORMAL LOW (ref 39.0–52.0)
Hemoglobin: 9.9 g/dL — ABNORMAL LOW (ref 13.0–17.0)
Hemoglobin: 8.2 g/dL — ABNORMAL LOW (ref 13.0–17.0)
O2 Saturation: 83 %
O2 Saturation: 65 %
Patient temperature: 98.6
Patient temperature: 97.5
Potassium: 5.5 mmol/L — ABNORMAL HIGH (ref 3.5–5.1)
Potassium: 4.8 mmol/L (ref 3.5–5.1)
Sodium: 134 mmol/L — ABNORMAL LOW (ref 135–145)
Sodium: 137 mmol/L (ref 135–145)
TCO2: 29 mmol/L (ref 22–32)
TCO2: 27 mmol/L (ref 22–32)
pCO2 arterial: 77.2 mmHg (ref 32.0–48.0)
pCO2 arterial: 65.6 mmHg (ref 32.0–48.0)
pH, Arterial: 7.143 — CL (ref 7.350–7.450)
pH, Arterial: 7.192 — CL (ref 7.350–7.450)
pO2, Arterial: 64 mmHg — ABNORMAL LOW (ref 83.0–108.0)
pO2, Arterial: 41 mmHg — ABNORMAL LOW (ref 83.0–108.0)

## 2019-06-06 LAB — GLUCOSE, CAPILLARY
Glucose-Capillary: 113 mg/dL — ABNORMAL HIGH (ref 70–99)
Glucose-Capillary: 117 mg/dL — ABNORMAL HIGH (ref 70–99)
Glucose-Capillary: 122 mg/dL — ABNORMAL HIGH (ref 70–99)
Glucose-Capillary: 127 mg/dL — ABNORMAL HIGH (ref 70–99)
Glucose-Capillary: 133 mg/dL — ABNORMAL HIGH (ref 70–99)
Glucose-Capillary: 133 mg/dL — ABNORMAL HIGH (ref 70–99)
Glucose-Capillary: 134 mg/dL — ABNORMAL HIGH (ref 70–99)
Glucose-Capillary: 144 mg/dL — ABNORMAL HIGH (ref 70–99)
Glucose-Capillary: 171 mg/dL — ABNORMAL HIGH (ref 70–99)
Glucose-Capillary: 203 mg/dL — ABNORMAL HIGH (ref 70–99)
Glucose-Capillary: 245 mg/dL — ABNORMAL HIGH (ref 70–99)
Glucose-Capillary: 306 mg/dL — ABNORMAL HIGH (ref 70–99)
Glucose-Capillary: 309 mg/dL — ABNORMAL HIGH (ref 70–99)
Glucose-Capillary: 333 mg/dL — ABNORMAL HIGH (ref 70–99)
Glucose-Capillary: 367 mg/dL — ABNORMAL HIGH (ref 70–99)
Glucose-Capillary: 398 mg/dL — ABNORMAL HIGH (ref 70–99)
Glucose-Capillary: 412 mg/dL — ABNORMAL HIGH (ref 70–99)
Glucose-Capillary: 443 mg/dL — ABNORMAL HIGH (ref 70–99)
Glucose-Capillary: 463 mg/dL — ABNORMAL HIGH (ref 70–99)

## 2019-06-06 LAB — CBC WITH DIFFERENTIAL/PLATELET
Abs Immature Granulocytes: 0.11 10*3/uL — ABNORMAL HIGH (ref 0.00–0.07)
Basophils Absolute: 0 10*3/uL (ref 0.0–0.1)
Basophils Relative: 0 %
Eosinophils Absolute: 0 10*3/uL (ref 0.0–0.5)
Eosinophils Relative: 0 %
HCT: 28.9 % — ABNORMAL LOW (ref 39.0–52.0)
Hemoglobin: 9 g/dL — ABNORMAL LOW (ref 13.0–17.0)
Immature Granulocytes: 1 %
Lymphocytes Relative: 5 %
Lymphs Abs: 0.6 10*3/uL — ABNORMAL LOW (ref 0.7–4.0)
MCH: 29.9 pg (ref 26.0–34.0)
MCHC: 31.1 g/dL (ref 30.0–36.0)
MCV: 96 fL (ref 80.0–100.0)
Monocytes Absolute: 0.6 10*3/uL (ref 0.1–1.0)
Monocytes Relative: 5 %
Neutro Abs: 10.9 10*3/uL — ABNORMAL HIGH (ref 1.7–7.7)
Neutrophils Relative %: 89 %
Platelets: 154 10*3/uL (ref 150–400)
RBC: 3.01 MIL/uL — ABNORMAL LOW (ref 4.22–5.81)
RDW: 14.9 % (ref 11.5–15.5)
WBC: 12.3 10*3/uL — ABNORMAL HIGH (ref 4.0–10.5)
nRBC: 0.7 % — ABNORMAL HIGH (ref 0.0–0.2)

## 2019-06-06 LAB — C-REACTIVE PROTEIN: CRP: 4.5 mg/dL — ABNORMAL HIGH (ref <1.0)

## 2019-06-06 LAB — COMPREHENSIVE METABOLIC PANEL
ALT: 93 U/L — ABNORMAL HIGH (ref 0–44)
AST: 85 U/L — ABNORMAL HIGH (ref 15–41)
Albumin: 3.6 g/dL (ref 3.5–5.0)
Alkaline Phosphatase: 164 U/L — ABNORMAL HIGH (ref 38–126)
Anion gap: 14 (ref 5–15)
BUN: 100 mg/dL — ABNORMAL HIGH (ref 8–23)
CO2: 26 mmol/L (ref 22–32)
Calcium: 6.3 mg/dL — CL (ref 8.9–10.3)
Chloride: 95 mmol/L — ABNORMAL LOW (ref 98–111)
Creatinine, Ser: 4.62 mg/dL — ABNORMAL HIGH (ref 0.61–1.24)
GFR calc Af Amer: 14 mL/min — ABNORMAL LOW (ref >60)
GFR calc non Af Amer: 12 mL/min — ABNORMAL LOW (ref >60)
Glucose, Bld: 461 mg/dL — ABNORMAL HIGH (ref 70–99)
Potassium: 5.9 mmol/L — ABNORMAL HIGH (ref 3.5–5.1)
Sodium: 135 mmol/L (ref 135–145)
Total Bilirubin: 0.7 mg/dL (ref 0.3–1.2)
Total Protein: 5.8 g/dL — ABNORMAL LOW (ref 6.5–8.1)

## 2019-06-06 LAB — RENAL FUNCTION PANEL
Albumin: 3.3 g/dL — ABNORMAL LOW (ref 3.5–5.0)
Anion gap: 14 (ref 5–15)
BUN: 100 mg/dL — ABNORMAL HIGH (ref 8–23)
CO2: 26 mmol/L (ref 22–32)
Calcium: 6.8 mg/dL — ABNORMAL LOW (ref 8.9–10.3)
Chloride: 100 mmol/L (ref 98–111)
Creatinine, Ser: 4.76 mg/dL — ABNORMAL HIGH (ref 0.61–1.24)
GFR calc Af Amer: 14 mL/min — ABNORMAL LOW (ref >60)
GFR calc non Af Amer: 12 mL/min — ABNORMAL LOW (ref >60)
Glucose, Bld: 133 mg/dL — ABNORMAL HIGH (ref 70–99)
Phosphorus: 6.2 mg/dL — ABNORMAL HIGH (ref 2.5–4.6)
Potassium: 5.1 mmol/L (ref 3.5–5.1)
Sodium: 140 mmol/L (ref 135–145)

## 2019-06-06 LAB — FERRITIN: Ferritin: 3769 ng/mL — ABNORMAL HIGH (ref 24–336)

## 2019-06-06 LAB — D-DIMER, QUANTITATIVE: D-Dimer, Quant: 4 ug/mL-FEU — ABNORMAL HIGH (ref 0.00–0.50)

## 2019-06-06 LAB — HEPARIN LEVEL (UNFRACTIONATED): Heparin Unfractionated: 0.24 IU/mL — ABNORMAL LOW (ref 0.30–0.70)

## 2019-06-06 LAB — POTASSIUM: Potassium: 5.1 mmol/L (ref 3.5–5.1)

## 2019-06-06 MED ORDER — PRISMASOL BGK 4/2.5 32-4-2.5 MEQ/L IV SOLN
INTRAVENOUS | Status: DC
  Administered 2019-06-06 – 2019-06-09 (×19): via INTRAVENOUS_CENTRAL

## 2019-06-06 MED ORDER — PRISMASOL BGK 4/2.5 32-4-2.5 MEQ/L REPLACEMENT SOLN
Status: DC
  Administered 2019-06-06 – 2019-06-07 (×2): via INTRAVENOUS_CENTRAL

## 2019-06-06 MED ORDER — INSULIN ASPART 100 UNIT/ML IV SOLN
10.0000 [IU] | Freq: Once | INTRAVENOUS | Status: AC
  Administered 2019-06-06: 10 [IU] via INTRAVENOUS

## 2019-06-06 MED ORDER — PRISMASOL BGK 0/2.5 32-2.5 MEQ/L IV SOLN
INTRAVENOUS | Status: DC
  Administered 2019-06-06 – 2019-06-08 (×5): via INTRAVENOUS_CENTRAL
  Filled 2019-06-06 (×6): qty 5000

## 2019-06-06 MED ORDER — SODIUM CHLORIDE 0.9 % IV SOLN
500.0000 [IU]/h | INTRAVENOUS | Status: DC
  Administered 2019-06-06 – 2019-06-08 (×4): 500 [IU]/h via INTRAVENOUS_CENTRAL
  Filled 2019-06-06: qty 10000
  Filled 2019-06-06: qty 2
  Filled 2019-06-06 (×2): qty 10000

## 2019-06-06 MED ORDER — CALCIUM GLUCONATE-NACL 1-0.675 GM/50ML-% IV SOLN
1.0000 g | Freq: Once | INTRAVENOUS | Status: AC
  Administered 2019-06-06: 1000 mg via INTRAVENOUS
  Filled 2019-06-06: qty 50

## 2019-06-06 MED ORDER — HEPARIN SODIUM (PORCINE) 1000 UNIT/ML IJ SOLN
1000.0000 [IU] | Freq: Once | INTRAMUSCULAR | Status: AC
  Administered 2019-06-06: 1000 [IU] via INTRAVENOUS
  Filled 2019-06-06: qty 1

## 2019-06-06 MED ORDER — INSULIN REGULAR BOLUS VIA INFUSION
0.0000 [IU] | Freq: Three times a day (TID) | INTRAVENOUS | Status: DC
  Filled 2019-06-06: qty 10

## 2019-06-06 MED ORDER — DEXTROSE 50 % IV SOLN: 25.0000 mL | INTRAVENOUS | Status: DC | PRN

## 2019-06-06 MED ORDER — STERILE WATER FOR INJECTION IV SOLN
INTRAVENOUS | Status: DC
  Administered 2019-06-06: 05:00:00 via INTRAVENOUS
  Filled 2019-06-06 (×2): qty 850

## 2019-06-06 MED ORDER — INSULIN REGULAR(HUMAN) IN NACL 100-0.9 UT/100ML-% IV SOLN
INTRAVENOUS | Status: DC
  Administered 2019-06-06: 13 [IU]/h via INTRAVENOUS
  Administered 2019-06-06: 05:00:00 3.5 [IU]/h via INTRAVENOUS
  Filled 2019-06-06 (×2): qty 100

## 2019-06-06 MED ORDER — HEPARIN SODIUM (PORCINE) 1000 UNIT/ML DIALYSIS
1000.0000 [IU] | INTRAMUSCULAR | Status: DC | PRN
  Filled 2019-06-06 (×2): qty 6

## 2019-06-06 MED ORDER — SODIUM CHLORIDE 0.45 % IV SOLN
INTRAVENOUS | Status: DC
  Administered 2019-06-06: 06:00:00 via INTRAVENOUS

## 2019-06-06 MED ORDER — SODIUM ZIRCONIUM CYCLOSILICATE 10 G PO PACK
10.0000 g | PACK | Freq: Once | ORAL | Status: AC
  Administered 2019-06-06: 10 g via ORAL
  Filled 2019-06-06: qty 1

## 2019-06-06 MED ORDER — SODIUM CHLORIDE 0.45 % IV BOLUS
500.0000 mL | Freq: Once | INTRAVENOUS | Status: AC
  Administered 2019-06-06: 500 mL via INTRAVENOUS

## 2019-06-06 MED ORDER — STERILE WATER FOR INJECTION IV SOLN
INTRAVENOUS | Status: DC
  Administered 2019-06-07: 01:00:00 via INTRAVENOUS
  Filled 2019-06-06: qty 850

## 2019-06-06 NOTE — Progress Notes (Signed)
NAME:  Robert Mcdaniel, MRN:  [id number], DOB:  Sep 29, 1951, LOS: 4 ADMISSION DATE:  05/31/2019, CONSULTATION DATE:  7/27 REFERRING MD:  Kim/Krishnan, CHIEF COMPLAINT:  Dyspnea   Brief History   68 y/o male admitted on 7/20 with ARDS from COVID 19 pneumonia.  Past Medical History  Hypertension Hyperlipidemia DM2 CAD  Significant Hospital Events   7/27 admission to [facility] 7/28 cardiac arrest, off o2, PEA, 6 mins  7/30 off sedation, no response to painful stimuli   Consults:  Pulmonary critical care  Procedures:  7/28 CPR - 2 rounds EPI, 1 amp bicarb, ROSC  7/28 Intubation  7/28 CVC RIJ   Significant Diagnostic Tests:  7/30 CT head - pending  7/30 EEG - pending   Micro Data:  7/27 SARS-COV-2 > positive  Antimicrobials/OOVID Rx:  7/27 remdesivir>  7/27 decadron>  7/27 tocilizumab>   Interim history/subjective:   Critically ill, intubated in the ICU.   Objective   Blood pressure (!) 115/55, pulse 69, temperature 97.6 F (36.4 C), temperature source Oral, resp. rate (!) 35, height 5' (1.524 m), weight 82.9 kg, SpO2 94 %. CVP:  [8 mmHg-30 mmHg] 29 mmHg  Vent Mode: PRVC FiO2 (%):  [60 %-80 %] 80 % Set Rate:  [35 bmp] 35 bmp Vt Set:  [400 mL] 400 mL PEEP:  [12 cmH20-14 cmH20] 14 cmH20 Plateau Pressure:  [30 cmH20-35 cmH20] 35 cmH20   Intake/Output Summary (Last 24 hours) at 06/06/2019 9518 Last data filed at 06/06/2019 0700 Gross per 24 hour  Intake 4008.98 ml  Output 200 ml  Net 3808.98 ml   Filed Weights   05/11/2019 0801 06/05/19 0500 06/06/19 0500  Weight: 72.6 kg 82 kg 82.9 kg    Examination:  General: Hispanic male, intubated sedated on mechanical ventilation no response to pain  hENT: NCAT, pupils reactive to light PULM: Bilateral ventilated breath sounds CV: Regular rate and rhythm, S1-S2 GI: Bowel sounds present, soft nontender mildly distended MSK: Normal bulk and tone Neuro: Still today with no response to pain no withdrawal to painful stimuli.  No  change.  Had been off sedation since early this morning.  Chest x-ray 06/03/2019: Diffuse bilateral interstitial alveolar opacities as seen prior consistent with multi lobar pneumonia and ARDS. The patient's images have been independently reviewed by me.     Resolved Hospital Problem list     Assessment & Plan:   ARDS due to SARS-COV2 pneumonia Continue mechanical ventilation per ARDS protocol Target TVol 6-8cc/kgIBW Target Plateau Pressure < 30cm H20 Target driving pressure less than 15 cm of water Target PaO2 55-65: titrate PEEP/FiO2 per protocol As long as PaO2 to FiO2 ratio is less than 1:150 position in prone position for 16 hours a day Check CVP daily if CVL in place Target CVP less than 4, diurese as necessary Ventilator associated pneumonia prevention protocol  Complete remdesivir X 5 days  Decadron 6 mg daily x10 days Status post tocolizumab dosing   He is volume positive.  With progressive renal failure.  He needs volume removed and unresponsive to Lasix administration from yesterday. We will discuss with family today for consent for CRRT. We have consulted nephrology. Plans for dialysis catheter  Shock, resolved  Acute on chronic systolic heart failure Cardiomyopathy Status post cardiac arrest, PEA, with return of spontaneous circulation, approximately 6 minutes Echocardiogram 06/03/2019: Severely depressed left ventricular ejection fraction 20% At this point no longer requiring vasopressors Volume overload with progressive renal failure in the setting of cardiomyopathy. We will need to  initiate CVVHD.  Sedation needs while on mechanical ventilation Continue to hold any additional sedation at this time.  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 We appreciate neurology input Agree that this could all be  related to persistent lingering effects of sedating medications plus renal failure  CAD Stopping home dose metoprolol Continue to observe  Acute renal failure AKI: improved with IVF Discussed care with nephrology today. Patient has been consented for initiation of CVVHD Plans for vascular access.  Hyperglycemia/DKA Per [facility]  Goals of care: Critically ill.  Prognosis discussed with family per [facility]   Best practice:  Diet: regular diet Pain/Anxiety/Delirium protocol (if indicated): n/a VAP protocol (if indicated): n/a DVT prophylaxis: Subcu heparin, COVID protocol GI prophylaxis: n/a Glucose control: SSI, Glargine per protocol Mobility: bed rest Code Status: full Family Communication: Per [facility] Disposition: remain in ICU  Labs   CBC: Recent Labs  Lab 05/24/2019 0055 06/03/19 0550 06/03/19 1547  06/04/19 0530 06/04/19 1630 06/05/19 0325 06/06/19 0401 06/06/19 0450  WBC 6.1 6.3 7.2  --  13.2*  --  13.2*  --  12.3*  NEUTROABS 5.6 5.5  --   --  11.7*  --  12.0*  --  10.9*  HGB 13.4 12.2* 11.5*   < > 10.6* 9.5* 11.1* 9.9* 9.0*  HCT 39.7 36.7* 36.6*   < > 33.1* 28.0* 34.3* 29.0* 28.9*  MCV 89.2 89.3 96.6  --  92.5  --  92.7  --  96.0  PLT 115* 156 121*  --  175  --  156  --  154   < > = values in this interval not displayed.    Basic Metabolic Panel: Recent Labs  Lab 06/03/19 2045  06/04/19 0530 06/04/19 1500 06/04/19 1630 06/04/19 1800 06/05/19 0325 06/05/19 1717 06/06/19 0401 06/06/19 0450  NA 140   < > 142  --  139  --  139 135 134* 135  K 4.7   < > 4.6  --  3.6  --  3.8 5.5* 5.5* 5.9*  CL 103  --  104  --   --   --  98 94*  --  95*  CO2 27  --  29  --   --   --  28 25  --  26  GLUCOSE 390*  --  115*  --   --   --  271* 505*  --  461*  BUN 62*  --  65*  --   --   --  77* 88*  --  100*  CREATININE 2.01*  --  2.42*  --   --   --  3.28* 3.93*  --  4.62*  CALCIUM 7.5*  --  6.9*  --   --   --  6.5* 6.1*  --  6.3*  MG 2.3  --   --  2.1  --  2.0 2.2 2.3  --   --    PHOS 8.3*  --   --  6.5*  --  6.3* 6.4* 7.6*  --   --    < > = values in this interval not displayed.   GFR: Estimated Creatinine Clearance: 14.2 mL/min (A) (by C-G formula based on SCr of 4.62 mg/dL (H)). Recent Labs  Lab 05/31/2019 1761 06/04/2019 6073 05/30/2019 0413 06/03/19 0550 06/03/19 1547 06/03/19 2045 06/04/19 0530 06/05/19 0325  06/06/19 0450  PROCALCITON  --   --  0.72 0.40  --   --  1.93  --   --   WBC 6.1  --   --  6.3 7.2  --  13.2* 13.2* 12.3*  LATICACIDVEN 2.3* 1.2  --   --  >11.0* 1.6  --   --   --     Liver Function Tests: Recent Labs  Lab 06/03/19 0550 06/03/19 1547 06/04/19 0530 06/05/19 0325 06/05/19 1717 06/06/19 0450  AST 123* 249* 501* 235*  --  85*  ALT 38 176* 198* 158*  --  93*  ALKPHOS 144* 145* 181* 195*  --  164*  BILITOT 0.7 0.7 0.6 0.5  --  0.7  PROT 6.9 5.5* 5.2* 5.2*  --  5.8*  ALBUMIN 2.9* 2.3* 2.2* 2.3* 2.8* 3.6   No results for input(s): LIPASE, AMYLASE in the last 168 hours. No results for input(s): AMMONIA in the last 168 hours.  ABG    Component Value Date/Time   PHART 7.143 (LL) 06/06/2019 0401   PCO2ART 77.2 (HH) 06/06/2019 0401   PO2ART 64.0 (L) 06/06/2019 0401   HCO3 26.5 06/06/2019 0401   TCO2 29 06/06/2019 0401   ACIDBASEDEF 3.0 (H) 06/06/2019 0401   O2SAT 83.0 06/06/2019 0401     Coagulation Profile: Recent Labs  Lab 06/05/2019 0055 06/03/19 1547  INR 1.0 1.2    Cardiac Enzymes: No results for input(s): CKTOTAL, CKMB, CKMBINDEX, TROPONINI in the last 168 hours.  HbA1C: Hgb A1c MFr Bld  Date/Time Value Ref Range Status  06/03/2019 05:50 AM 13.8 (H) 4.8 - 5.6 % Final    Comment:    (NOTE) Pre diabetes:          5.7%-6.4% Diabetes:              >6.4% Glycemic control for   <7.0% adults with diabetes     CBG: Recent Labs  Lab 06/05/19 2120 06/06/19 0017 06/06/19 0431 06/06/19 0521 06/06/19 0629  GLUCAP 498* 443* 463* 412* 398*    This patient is critically ill with multiple organ system failure;  which, requires frequent high complexity decision making, assessment, support, evaluation, and titration of therapies. This was completed through the application of advanced monitoring technologies and extensive interpretation of multiple databases. During this encounter critical care time was devoted to patient care services described in this note for 34 minutes.  Garner Nash, DO [facility] 06/06/2019 7:29 AM  Personal pager: [phone] If unanswered, please page CCM On-call: [phone]

## 2019-06-06 NOTE — Progress Notes (Signed)
[place] Progress Note Patient Name: Robert Mcdaniel DOB: Dec 19, 1950 MRN: [id number]   Date of Service  06/06/2019  HPI/Events of Note  Hypoxemia - Oxygen sats = 82% to 87%.   eICU Interventions  Will order: 1. Portable CXR STAT. 2. ABG STAT.     Intervention Category Major Interventions: Hypoxemia - evaluation and management  Sommer,Steven Eugene 06/06/2019, 11:04 PM

## 2019-06-06 NOTE — Consult Note (Signed)
[facility] Renal Consultation Note  Requesting MD: Memon  Indication for Consultation:  AKI   Chief complaint: shortness of breath  HPI:  Robert Mcdaniel is a 68 y.o. male with a history of hypertension, diabetes, CAD, hyperlipidemia, and status post pacemaker placement who presented to the hospital with shortness of breath.  He was found to be COVID positive.  He was per report on 15 L of oxygen and reportedly took this off and later collapsed and was found to have PEA arrest with agonal breathing.  Patient was resuscitated.  He had 2 rounds of epi with ROSC then lost pulse again with intubation, received CPR, and regained after 2 minutes per charting.  Primary team indicates that he may have been somewhat dry on presentation and creatinine had improved from 2.3 down to 1.5 nadir.  He has recently had worsening acute renal failure with creatinine 4.62 this morning and concurrent hyperkalemia and BUN 100.  He had been on IV fluids but was edematous so the team stopped fluids and started Lasix and albumin yesterday.  Work-up is notable for an echo with a new EF of 20-25%.  This was post arrest and was with diffuse LV hypokinesis.  Team is unsure of chronicity of these findings.  Note that he is here from [place] and so there is limited background medical information available.  His course has been complicated by encephalopathy with recent insults of the arrest as well as sedation.  Labs trended as below.  I spoke with his wife and daugther.  His family would want for him to have aggressive measures such as dialysis.  He does occasionally take motrin.  His family states that they were recently told that one of his kidneys was not working well - he had labs about a month ago in [place] and had seen a doctor for his.   Due to the nature of this patient's [id number] with isolation and in keeping with efforts to prevent the spread of infection and to conserve personal protective equipment, a physical  exam was not personally performed.  Patient's symptoms and exam were discussed in detail with the RN.  A chart review of other providers notes and the patient's lab work as well as review of other pertinent studies was performed.  Exam details from prior documentation were reviewed specifically and confirmed with the bedside nurse.    Creatinine, Ser  Date/Time Value Ref Range Status  06/06/2019 04:50 AM 4.62 (H) 0.61 - 1.24 mg/dL Final  06/05/2019 05:17 PM 3.93 (H) 0.61 - 1.24 mg/dL Final  06/05/2019 03:25 AM 3.28 (H) 0.61 - 1.24 mg/dL Final  06/04/2019 05:30 AM 2.42 (H) 0.61 - 1.24 mg/dL Final  06/03/2019 08:45 PM 2.01 (H) 0.61 - 1.24 mg/dL Final  06/03/2019 03:47 PM 1.92 (H) 0.61 - 1.24 mg/dL Final  06/03/2019 05:50 AM 1.49 (H) 0.61 - 1.24 mg/dL Final  06/05/2019 08:05 PM 1.56 (H) 0.61 - 1.24 mg/dL Final  06/05/2019 12:35 PM 2.00 (H) 0.61 - 1.24 mg/dL Final  05/22/2019 12:55 AM 2.34 (H) 0.61 - 1.24 mg/dL Final    PMHx:   Past Medical History:  Diagnosis Date  . CAD (coronary artery disease)   . Diabetes ([place])   . Hyperlipidemia   . Hypertension     Past Surgical History:  Procedure Laterality Date  . CORONARY ARTERY BYPASS GRAFT    . PACEMAKER INSERTION      Family Hx:  Family History  Problem Relation Age of Onset  . Heart  attack Father     Social History:  reports that he has quit smoking. He has never used smokeless tobacco. He reports that he does not drink alcohol or use drugs.  Allergies: No Known Allergies  Medications: Prior to Admission medications   Medication Sig Start Date End Date Taking? Authorizing Provider  acetaminophen (TYLENOL) 500 MG tablet Take 1,000 mg by mouth every 6 (six) hours as needed for moderate pain or fever.   Yes [provider]  Ascorbic Acid (VITAMIN C) 1000 MG tablet Take 1,000 mg by mouth daily.   Yes [provider]  aspirin EC 81 MG tablet Take 81 mg by mouth daily.   Yes [provider]  atorvastatin  (LIPITOR) 40 MG tablet Take 40 mg by mouth daily.   Yes [provider]  cholecalciferol (VITAMIN D3) 25 MCG (1000 UT) tablet Take 1,000 Units by mouth daily.   Yes [provider]  docusate sodium (COLACE) 100 MG capsule Take 100 mg by mouth daily.   Yes [provider]  insulin glargine (LANTUS) 100 UNIT/ML injection Inject 43-45 Units into the skin daily as needed. High Sugar. Only take if >120.   Yes [provider]  metoprolol succinate (TOPROL-XL) 50 MG 24 hr tablet Take 50 mg by mouth daily. Take with or immediately following a meal.   Yes [provider]  Multiple Vitamin (MULTIVITAMIN WITH MINERALS) TABS tablet Take 1 tablet by mouth daily.   Yes [provider]    I have reviewed the patient's current medications and reported prior to admission medications.  Labs:  BMP Latest Ref Rng & Units 06/06/2019 06/06/2019 06/05/2019  Glucose 70 - 99 mg/dL 461(H) - 505(HH)  BUN 8 - 23 mg/dL 100(H) - 88(H)  Creatinine 0.61 - 1.24 mg/dL 4.62(H) - 3.93(H)  Sodium 135 - 145 mmol/L 135 134(L) 135  Potassium 3.5 - 5.1 mmol/L 5.9(H) 5.5(H) 5.5(H)  Chloride 98 - 111 mmol/L 95(L) - 94(L)  CO2 22 - 32 mmol/L 26 - 25  Calcium 8.9 - 10.3 mg/dL 6.3(LL) - 6.1(LL)    Urinalysis    Component Value Date/Time   COLORURINE YELLOW 05/07/2019 0059   APPEARANCEUR CLOUDY (A) 06/01/2019 0059   LABSPEC 1.017 05/12/2019 0059   PHURINE 5.0 05/07/2019 0059   GLUCOSEU 50 (A) 05/19/2019 0059   HGBUR SMALL (A) 05/25/2019 0059   BILIRUBINUR NEGATIVE 05/28/2019 0059   KETONESUR 5 (A) 06/04/2019 0059   PROTEINUR 100 (A) 05/08/2019 0059   NITRITE NEGATIVE 05/26/2019 0059   LEUKOCYTESUR NEGATIVE 05/10/2019 0059     ROS:  Unable to obtain 2/2 intubated   Physical Exam: Vitals:   06/06/19 0735 06/06/19 1141  BP: (!) 123/48 (!) 112/47  Pulse: 67 69  Resp: (!) 35 (!) 35  Temp:    SpO2: 91% 94%     Exam per nursing.  On levophed 10 mcg/min General: adult  male in bed, not proned HEENT:  ET tube in place Heart: paced rhythm  Lungs: generally diminished  Abdomen: distended , hypoactive bowel sounds Extremities: generalized swelling   Neuro: getting intermittent sedation    Assessment/Plan:  # AKI  - Secondary to ATN related to arrest.  May have underlying CKD  - Start CRRT  - Appreciate critical care placing access  - Discussed with critical care.  Ok for goal 25 to 50cc/hr as tolerated - Agree with renal ultrasound - note family report of an issue with one of his kidneys   # Hyperkalemia - 2/2  AKI  - start CRRT   # S/p cardiac arrest  - Supportive care per primary team   # Covid 19 infection  - Therapies per pulm and primary team   # Acute hypoxic respiratory failure  - mechanical ventilation per pulmonology   # Encephalopathy - s/p arrest - team is weaning sedating medications - team has contacted neurology   # Acute systolic CHF  - UF with CRRT as tolerated   Claudia Desanctis 06/06/2019, 1:05 PM

## 2019-06-06 NOTE — Progress Notes (Signed)
Bedside rounds done with Dr. Valeta Harms, Dr. Roderic Palau, RT, and RN. Updated of pt condition and collaborated for plan of care. Per Dr. Valeta Harms, stop amiodarone gtt and bicarb gtt at this time. Both drips stopped.

## 2019-06-06 NOTE — Progress Notes (Signed)
PCCM:  I called the patients daughter and obtained consent for placement of the dialysis catheter.  We discussed the risks, benefits and alternatives She is agreeable to proceed.   [facility] 06/06/2019 1:44 PM

## 2019-06-06 NOTE — Progress Notes (Signed)
Pt's daughter called to unit. Updated of pt condition. Informed of plan of care. Pt's daughter appreciative of update.

## 2019-06-06 NOTE — Progress Notes (Signed)
[place] Progress Note Patient Name: Robert Mcdaniel DOB: December 30, 1950 MRN: [id number]   Date of Service  06/06/2019  HPI/Events of Note  Hypoxia - ABG on 100%/PRVC 35/TV 400//P 16 = 7.19/65.6/41/25.4. CXR diffuse interstitial interstitial infiltrates and no pneumothorax.  eICU Interventions  Will order: 1. Increase PEEP to 18. 2. Recruitment maneuver PRN.      Intervention Category Major Interventions: Acid-Base disturbance - evaluation and management;Respiratory failure - evaluation and management  Lysle Dingwall 06/06/2019, 11:56 PM

## 2019-06-06 NOTE — Progress Notes (Addendum)
[place] Progress Note Patient Name: Robert Mcdaniel DOB: September 04, 1951 MRN: [id number]   Date of Service  06/06/2019  HPI/Events of Note  ABG on 80%/PRVC 35/TV 400/P 14 = 7.143/77.2/64.0. No real room to increase TV, PRVC rate or PEEP.  Pplat = 36.   eICU Interventions  Will order: 1. NaHCO3 IV infusion to run IV at 50 mL/hour.  2. Repeat ABG at 8 AM.     Intervention Category Major Interventions: Acid-Base disturbance - evaluation and management;Respiratory failure - evaluation and management  Sommer,Steven Eugene 06/06/2019, 5:00 AM

## 2019-06-06 NOTE — Progress Notes (Signed)
PROGRESS NOTE  Robert Mcdaniel KDX:[id number] DOB: 09/10/1951 DOA: 05/12/2019  PCP: Patient, No Pcp Per  Brief History/Interval Summary: 68 year old male who speaks only Spanish with a past medical history of coronary artery disease, diabetes mellitus type 2 on insulin, possible chronic kidney disease unspecified stage presented with complains of shortness of breath dry cough.  Patient had nausea vomiting a few days ago.  Patient was found to be positive for COVID-19.  Chest x-ray showed pneumonia.  Patient was hospitalized for further management.  He was hypoxic.    Reason for Visit: Acute respiratory disease due to COVID-19  Consultants: Pulmonology, Nephrology, Palliative Medicine  Procedures:   7/28: Intubated 7/28: Central line placement  7/28 transthoracic echocardiogram  1. The left ventricle has severely reduced systolic function, with an ejection fraction of 20-25%. Left ventricular diffuse hypokinesis.  2. Severe hypokinesis of the left ventricular, entire anterior wall.  3. The inferior vena cava was dilated in size with <50% respiratory variability.   Antibiotics: Anti-infectives (From admission, onward)   Start     Dose/Rate Route Frequency Ordered Stop   06/03/19 1100  remdesivir 100 mg in sodium chloride 0.9 % 250 mL IVPB     100 mg 500 mL/hr over 30 Minutes Intravenous Every 24 hours 06/06/2019 0927 06/06/19 1255   05/30/2019 1100  remdesivir 200 mg in sodium chloride 0.9 % 250 mL IVPB     200 mg 500 mL/hr over 30 Minutes Intravenous Once 05/20/2019 0927 05/29/2019 1124   05/23/2019 0500  azithromycin (ZITHROMAX) 500 mg in sodium chloride 0.9 % 250 mL IVPB  Status:  Discontinued     500 mg 250 mL/hr over 60 Minutes Intravenous Daily 05/07/2019 0416 05/09/2019 1247   05/21/2019 0430  cefTRIAXone (ROCEPHIN) 2 g in sodium chloride 0.9 % 100 mL IVPB  Status:  Discontinued     2 g 200 mL/hr over 30 Minutes Intravenous Daily 05/27/2019 0416 06/03/2019 1247       Subjective/Interval  History: Patient remains unresponsive on vent. Poor urinary output to lasix.     Assessment/Plan:  Acute Hypoxic Resp. Failure due to Acute Covid 19 Viral Illness  COVID-19 Labs  Recent Labs    06/04/19 0530 06/04/19 0547 06/05/19 0325 06/06/19 0450  DDIMER 4.65*  --  3.11* 4.00*  FERRITIN  --  7,301* 7,410* 3,769*  CRP  --  8.8* 6.8* 4.5*    Lab Results  Component Value Date   SARSCOV2NAA POSITIVE (A) 05/17/2019   SARSCOV2NAA Detected (A) 05/26/2019     Fever: Afebrile Oxygen requirements: Mechanical ventilation.  70-800% FiO2.  Saturating in the low 90s.   Antibacterials: Was given ceftriaxone and azithromycin in the ED. Not continued. Remdesivir: He has completed a 5 day course Steroids: Dexamethasone changed over to Solu-Medrol 40 mg every 12 hours Diuretics: Not on diuretics on a scheduled basis Actemra: 1 dose given on 7/27 DVT Prophylaxis: On [place] heparin  Research Studies: Not enrolled into any research studies yet.  Patient intubated sedated.  Pulmonology is following and managing.  Prognosis is poor due to his significantly acute systolic CHF.  From a COVID-19 standpoint he has completed a course of Remdesivir and is still on steroids.  Inflammatory markers were trending down.  Procalcitonin improved from 0.7 to 0.4.  He did get a dose of ceftriaxone and azithromycin in the emergency department.  Not continued. Lactic acid level was 2.3 initially.  Improved to 1.2.  Patient procalcitonin noted to 1.93.  WBC is 13.2 which could be  due to steroids.  Continue to monitor off of antibiotics at this time.  Acute systolic CHF/cardiac arrest EF is around 20%.  Patient's cardiac arrest likely precipitated by hypoxia in the setting of congestive heart failure.  He had to be resuscitated twice.  He was given epinephrine bicarbonate and calcium chloride.  He is on amiodarone infusion.  He briefly required Levophed, but now that is been weaned off. No ACE inhibitor is due to  renal failure, shock.  Patient's cardiac status was discussed by Dr. [place] Pink with Dr. Aundra Dubin with the heart failure team.  He recommended venous blood gas.  This was done and saturation on that gas was 42%.  Dobutamine was recommended at 2.5 mcg/kg/min.  Since that time, patient's hemodynamics have improved.  Dobutamine has been weaned off and blood pressure is elevated.  He is given a trial of intravenous Lasix, but has not had any significant urine output.  Acute renal failure Baseline renal function is not known.  Patient could have CKD based on his history however this is unspecified.  Initially there was concern for hypovolemia.  Patient was given IV fluids with improvement in renal function.  However he went into cardiac arrest as discussed above.  Creatinine has started to decline.  After receiving a trial of Lasix today, renal function is worse.  Urine output remains poor.  Discussed with nephrology and plans are to start CRRT today.  Discussed with family who are also in agreement with this plan.  Will check renal ultrasound to rule out any obstructive process  Transaminitis Significant bump in his AST and ALT are due to shock.  Trending down.  Continue to monitor.  History of diabetes mellitus type 2, uncontrolled with hyperglycemia/DKA Patient had elevated anion gap and was noted to have ketones in his urine.  Likely was in DKA as he did have nausea vomiting for a few days.  Patient was continued on IV insulin infusion.  His bicarbonate has improved.  His anion gap closed.  He was transitioned to long-acting insulin.  A1c is 13.8.  Since that time, he has had recurrent hyperglycemia and was placed back on insulin infusion overnight.   History of coronary artery disease status post CABG EKG showed nonspecific T wave changes.  No old EKG available for comparison.  High-sensitivity troponin levels not significantly elevated.  And then of course he had cardiac arrest as discussed above.   Continue aspirin.  Will use beta-blockade once his hemodynamics have improved.     Essential hypertension Currently stable, recently weaned off pressors  Obesity  Estimated body mass index is 35.68 kg/m as calculated from the following:   Height as of this encounter: 5' (1.524 m).   Weight as of this encounter: 82.9 kg.  Goals of care Considering that patient has had 2 episodes of cardiac arrest requiring CPR, and with his significant CHF with a EF only of 20% the chances of meaningful recovery if he has another episode of cardiac arrest is extremely low.  Discussed with Dr. Valeta Harms with critical care medicine who agrees.  Patient made DNR and family was informed.  Long discussion with patient's family, explaining his multiple comorbidities, and current decompensation.  I explained that placing him on CRRT would be an option, although with his comorbidities, there is a fair chance that he may not survive this current illness.  If CRRT is started, his recovery would likely be prolonged.  They wish to continue with current treatments.  Palliative care also involved  in conversations with family.    DVT Prophylaxis: heparin PUD Prophylaxis: Famotidine Code Status: DNR Family Communication: discussed with daughter and multiple family members over the phone regarding poor prognosis. 7/31 Disposition Plan: Continue ICU monitoring for now   Medications:  Scheduled:  artificial tears  1 application Both Eyes Y6A   aspirin  81 mg Oral Daily   chlorhexidine gluconate (MEDLINE KIT)  15 mL Mouth Rinse BID   Chlorhexidine Gluconate Cloth  6 each Topical Q0600   heparin injection (subcutaneous)  7,500 Units Subcutaneous Q8H   insulin regular  0-10 Units Intravenous TID WC   mouth rinse  15 mL Mouth Rinse 10 times per day   methylPREDNISolone (SOLU-MEDROL) injection  40 mg Intravenous Q12H   multivitamin with minerals  1 tablet Oral Daily   Continuous:  sodium chloride Stopped (06/06/19  0958)   dextrose     famotidine (PEPCID) IV Stopped (06/06/19 1034)   feeding supplement (VITAL AF 1.2 CAL) 1,000 mL (06/06/19 0643)   fentaNYL infusion INTRAVENOUS Stopped (06/05/19 0600)   insulin 11.4 Units/hr (06/06/19 1327)   midazolam Stopped (06/05/19 0400)   norepinephrine (LEVOPHED) Adult infusion 2 mcg/min (06/06/19 1305)   YTK:ZSWFUXNA, dextrose, dextrose, fentaNYL, hydrALAZINE, midazolam   Objective:  Vital Signs  Vitals:   06/06/19 0735 06/06/19 1141 06/06/19 1200 06/06/19 1300  BP: (!) 123/48 (!) 112/47 (!) 109/55 (!) 80/48  Pulse: 67 69 69 (!) 59  Resp: (!) 35 (!) 35 (!) 35 (!) 35  Temp:      TempSrc:      SpO2: 91% 94% 94% 91%  Weight:      Height:        Intake/Output Summary (Last 24 hours) at 06/06/2019 1342 Last data filed at 06/06/2019 1300 Gross per 24 hour  Intake 3125.69 ml  Output 115 ml  Net 3010.69 ml   Filed Weights   05/21/2019 0801 06/05/19 0500 06/06/19 0500  Weight: 72.6 kg 82 kg 82.9 kg    General exam: unresponsive Respiratory system: crackles at bases. Respiratory effort normal. Cardiovascular system:RRR. No murmurs, rubs, gallops. Gastrointestinal system: Abdomen is nondistended, soft and nontender. No organomegaly or masses felt. Normal bowel sounds heard. Central nervous system: unresponsive Extremities: bilateral dependent edema Skin: No rashes, lesions or ulcers Psychiatry: Carlene Coria     Lab Results:  Data Reviewed: I have personally reviewed following labs and imaging studies  CBC: Recent Labs  Lab 05/22/2019 0055 06/03/19 0550 06/03/19 1547  06/04/19 0530 06/04/19 1630 06/05/19 0325 06/06/19 0401 06/06/19 0450  WBC 6.1 6.3 7.2  --  13.2*  --  13.2*  --  12.3*  NEUTROABS 5.6 5.5  --   --  11.7*  --  12.0*  --  10.9*  HGB 13.4 12.2* 11.5*   < > 10.6* 9.5* 11.1* 9.9* 9.0*  HCT 39.7 36.7* 36.6*   < > 33.1* 28.0* 34.3* 29.0* 28.9*  MCV 89.2 89.3 96.6  --  92.5  --  92.7  --  96.0  PLT 115* 156 121*  --   175  --  156  --  154   < > = values in this interval not displayed.    Basic Metabolic Panel: Recent Labs  Lab 06/03/19 2045  06/04/19 0530 06/04/19 1500 06/04/19 1630 06/04/19 1800 06/05/19 0325 06/05/19 1717 06/06/19 0401 06/06/19 0450  NA 140   < > 142  --  139  --  139 135 134* 135  K 4.7   < > 4.6  --  3.6  --  3.8 5.5* 5.5* 5.9*  CL 103  --  104  --   --   --  98 94*  --  95*  CO2 27  --  29  --   --   --  28 25  --  26  GLUCOSE 390*  --  115*  --   --   --  271* 505*  --  461*  BUN 62*  --  65*  --   --   --  77* 88*  --  100*  CREATININE 2.01*  --  2.42*  --   --   --  3.28* 3.93*  --  4.62*  CALCIUM 7.5*  --  6.9*  --   --   --  6.5* 6.1*  --  6.3*  MG 2.3  --   --  2.1  --  2.0 2.2 2.3  --   --   PHOS 8.3*  --   --  6.5*  --  6.3* 6.4* 7.6*  --   --    < > = values in this interval not displayed.    GFR: Estimated Creatinine Clearance: 14.2 mL/min (A) (by C-G formula based on SCr of 4.62 mg/dL (H)).  Liver Function Tests: Recent Labs  Lab 06/03/19 0550 06/03/19 1547 06/04/19 0530 06/05/19 0325 06/05/19 1717 06/06/19 0450  AST 123* 249* 501* 235*  --  85*  ALT 38 176* 198* 158*  --  93*  ALKPHOS 144* 145* 181* 195*  --  164*  BILITOT 0.7 0.7 0.6 0.5  --  0.7  PROT 6.9 5.5* 5.2* 5.2*  --  5.8*  ALBUMIN 2.9* 2.3* 2.2* 2.3* 2.8* 3.6     Coagulation Profile: Recent Labs  Lab 05/29/2019 0055 06/03/19 1547  INR 1.0 1.2     CBG: Recent Labs  Lab 06/06/19 0843 06/06/19 0953 06/06/19 1101 06/06/19 1217 06/06/19 1325  GLUCAP 367* 333* 306* 245* 203*    Anemia Panel: Recent Labs    06/05/19 0325 06/06/19 0450  FERRITIN 7,410* 3,769*    Recent Results (from the past 240 hour(s))  Culture, blood (Routine x 2)     Status: None (Preliminary result)   Collection Time: 05/22/2019 12:55 AM   Specimen: BLOOD  Result Value Ref Range Status   Specimen Description   Final    BLOOD LEFT ANTECUBITAL Performed at [facility], [place] [address] [place], [place] [address]    Special Requests   Final    BOTTLES DRAWN AEROBIC AND ANAEROBIC Blood Culture results may not be optimal due to an excessive volume of blood received in culture bottles Performed at [place] [address] [place], [place] [address]    Culture   Final    NO GROWTH 3 DAYS Performed at [facility], [place] [address] [place], [place] [address]    Report Status PENDING  Incomplete  Culture, blood (Routine x 2)     Status: None (Preliminary result)   Collection Time: 05/18/2019 12:59 AM   Specimen: BLOOD RIGHT FOREARM  Result Value Ref Range Status   Specimen Description   Final    BLOOD RIGHT FOREARM Performed at [facility], [place] [address], [place], [place] [address]    Special Requests   Final    BOTTLES DRAWN AEROBIC AND ANAEROBIC Blood Culture adequate volume Performed at [place] [address] [place], [place] [address]  Culture   Final    NO GROWTH 3 DAYS Performed at [facility], [place] [address] [place], [place] [address]    Report Status PENDING  Incomplete  SARS Coronavirus 2 (CEPHEID - Performed in [place] hospital lab), Hosp Order     Status: Abnormal   Collection Time: 05/28/2019  3:52 AM   Specimen: Nasopharyngeal Swab  Result Value Ref Range Status   SARS Coronavirus 2 POSITIVE (A) NEGATIVE Final    Comment: RESULT CALLED TO, READ BACK BY AND VERIFIED WITH: S.BINGHAM RN AT 8502 ON 05/24/2019 BY S.VANHOORNE (NOTE) If result is NEGATIVE SARS-CoV-2 target nucleic acids are NOT DETECTED. The SARS-CoV-2 RNA is generally detectable in upper and lower  respiratory specimens during the acute phase of infection. The lowest  concentration of SARS-CoV-2 viral copies this assay can detect is 250  copies / mL. A negative result does not preclude SARS-CoV-2 infection  and should not be used as the sole basis for treatment or other  patient management decisions.  A  negative result may occur with  improper specimen collection / handling, submission of specimen other  than nasopharyngeal swab, presence of viral mutation(s) within the  areas targeted by this assay, and inadequate number of viral copies  (<250 copies / mL). A negative result must be combined with clinical  observations, patient history, and epidemiological information. If result is POSITIVE SARS-CoV-2 target nucleic acids are  DETECTED. The SARS-CoV-2 RNA is generally detectable in upper and lower  respiratory specimens during the acute phase of infection.  Positive  results are indicative of active infection with SARS-CoV-2.  Clinical  correlation with patient history and other diagnostic information is  necessary to determine patient infection status.  Positive results do  not rule out bacterial infection or co-infection with other viruses. If result is PRESUMPTIVE POSTIVE SARS-CoV-2 nucleic acids MAY BE PRESENT.   A presumptive positive result was obtained on the submitted specimen  and confirmed on repeat testing.  While 2019 novel coronavirus  (SARS-CoV-2) nucleic acids may be present in the submitted sample  additional confirmatory testing may be necessary for epidemiological  and / or clinical management purposes  to differentiate between  SARS-CoV-2 and other Sarbecovirus currently known to infect humans.  If clinically indicated additional testing with an alternate test  methodology  [phone]) is advised. The SARS-CoV-2 RNA is generally  detectable in upper and lower respiratory specimens during the acute  phase of infection. The expected result is Negative. Fact Sheet for Patients:  StrictlyIdeas.no Fact Sheet for Healthcare Providers: BankingDealers.co.za This test is not yet approved or cleared by the [place] FDA and has been authorized for detection and/or diagnosis of SARS-CoV-2 by FDA under an Emergency Use  Authorization (EUA).  This EUA will remain in effect (meaning this test can be used) for the duration of the COVID-19 declaration under Section 564(b)(1) of the Act, 21 U.S.C. section 360bbb-3(b)(1), unless the authorization is terminated or revoked sooner. Performed at [facility], [place] [address], [place], [place] [address]   MRSA PCR Screening     Status: None   Collection Time: 05/25/2019  4:54 PM   Specimen: Nasal Mucosa; Nasopharyngeal  Result Value Ref Range Status   MRSA by PCR NEGATIVE NEGATIVE Final    Comment:        The GeneXpert MRSA Assay (FDA approved for NASAL specimens only), is one component of a comprehensive MRSA colonization surveillance program. It is not intended to diagnose MRSA infection  nor to guide or monitor treatment for MRSA infections. Performed at [facility], [place] [address], [place], [place] [address]       Radiology Studies: Ct Head Wo Contrast  Result Date: 06/05/2019 CLINICAL DATA:  Cardiac arrest EXAM: CT HEAD WITHOUT CONTRAST TECHNIQUE: Contiguous axial images were obtained from the base of the skull through the vertex without intravenous contrast. COMPARISON:  None. FINDINGS: Brain: No evidence of acute infarction, hemorrhage, hydrocephalus, extra-axial collection or mass lesion/mass effect. Vascular: No hyperdense vessel or unexpected calcification. Skull: Normal. Negative for fracture or focal lesion. Sinuses/Orbits: No acute finding. Other: None. IMPRESSION: No acute intracranial pathology. Electronically Signed   By: Eddie Candle M.D.   On: 06/05/2019 13:45    Critical care time: 11mns   LOS: 4 days   [employer] [email or web address]  06/06/2019, 1:42 PM

## 2019-06-06 NOTE — Progress Notes (Signed)
Night shift hospitalist coverage note  The patient's hyperglycemia has not responded to 2 doses of 20 units of SQ NovoLog.  He is currently getting glucocorticoids while on mechanical ventilation.  The nursing staff also reports that his output for the shift has only been 35 mL.  We will try a 500 mL half NS bolus along with 10 units of NovoLog intravenously.  We will continue to follow-up CBGs.  Will consider starting insulin infusion if hyperglycemia does not respond to these measures.  Tennis Must, MD

## 2019-06-06 NOTE — Progress Notes (Signed)
Daily Progress Note   Patient Name: Robert Mcdaniel       Date: 06/06/2019 DOB: 12-18-1950  Age: 68 y.o. MRN#: [id number] Attending Physician: Kathie Dike, MD Primary Care Physician: Patient, No Pcp Per Admit Date: 05/29/2019  Reason for Consultation/Follow-up: Establishing goals of care  Subjective: Per RN - patient remains unresponsive, had to restart pressors, CRRT starting today  Length of Stay: 4  Current Medications: Scheduled Meds:  . artificial tears  1 application Both Eyes G8U  . aspirin  81 mg Oral Daily  . chlorhexidine gluconate (MEDLINE KIT)  15 mL Mouth Rinse BID  . Chlorhexidine Gluconate Cloth  6 each Topical Q0600  . heparin  1,000 Units Intravenous Once  . heparin injection (subcutaneous)  7,500 Units Subcutaneous Q8H  . insulin regular  0-10 Units Intravenous TID WC  . mouth rinse  15 mL Mouth Rinse 10 times per day  . methylPREDNISolone (SOLU-MEDROL) injection  40 mg Intravenous Q12H  . multivitamin with minerals  1 tablet Oral Daily    Continuous Infusions: .  prismasol BGK 4/2.5    . sodium chloride Stopped (06/06/19 0958)  . calcium gluconate    . dextrose    . famotidine (PEPCID) IV Stopped (06/06/19 1034)  . feeding supplement (VITAL AF 1.2 CAL) 1,000 mL (06/06/19 0643)  . fentaNYL infusion INTRAVENOUS Stopped (06/05/19 0600)  . heparin 10,000 units/ 20 mL infusion syringe    . insulin 11.4 mL/hr at 06/06/19 1400  . midazolam Stopped (06/05/19 0400)  . norepinephrine (LEVOPHED) Adult infusion 10 mcg/min (06/06/19 1400)  . prismasol BGK 2/2.5 replacement solution    . prismasol BGK 4/2.5      PRN Meds: dextrose, dextrose, dextrose, fentaNYL, heparin, hydrALAZINE, midazolam   Vital Signs: BP (!) 126/55   Pulse 66   Temp 97.9 F (36.6 C) (Oral)   Resp (!)  35   Ht 5' (1.524 m)   Wt 82.9 kg   SpO2 90%   BMI 35.68 kg/m  SpO2: SpO2: 90 % O2 Device: O2 Device: Ventilator O2 Flow Rate: O2 Flow Rate (L/min): (S) 25 L/min  Intake/output summary:   Intake/Output Summary (Last 24 hours) at 06/06/2019 1445 Last data filed at 06/06/2019 1400 Gross per 24 hour  Intake 3021.56 ml  Output 110 ml  Net 2911.56 ml   LBM: Last BM Date: 06/03/19 Baseline Weight: Weight: 72.6 kg Most recent weight: Weight: 82.9 kg       Palliative Assessment/Data: PPS 30% (pt on tube feeds)    Flowsheet Rows     Most Recent Value  Intake Tab  Referral Department  Hospitalist  Unit at Time of Referral  ICU  Palliative Care Primary Diagnosis  Sepsis/Infectious Disease  Date Notified  06/04/19  Palliative Care Type  New Palliative care  Reason for referral  Clarify Goals of Care  Date of Admission  05/18/2019  Date first seen by Palliative Care  06/05/19  # of days Palliative referral response time  1 Day(s)  # of days IP prior to Palliative referral  2  Clinical Assessment  Palliative Performance Scale Score  30%  Psychosocial & Spiritual Assessment  Palliative Care Outcomes  Patient/Family meeting held?  Yes  Who  was at the meeting?  2 daughters, 2 sons  Palliative Care Outcomes  Provided psychosocial or spiritual support      Patient Active Problem List   Diagnosis Date Noted  . Goals of care, counseling/discussion   . Palliative care by specialist   . COVID-19 virus infection 05/19/2019  . CAP (community acquired pneumonia) 05/23/2019  . Essential hypertension 06/05/2019  . Hyperlipidemia 05/18/2019  . Diabetes ([place]) 05/07/2019  . Acute renal failure (ARF) ([place]) 06/04/2019  . Protein-calorie malnutrition, severe ([place]) 05/22/2019  . COVID-19 05/11/2019  . Acute respiratory failure with hypoxia ([place])   . AKI (acute kidney injury) [facility])     Palliative Care Assessment & Plan   HPI: 68 y.o. male  with past medical history of CAD, T2DM, HLD,  and HTN admitted on 06/06/2019 with dry cough and dyspnea. Found to be COVID positive. 7/28 patient experienced cardiac arrest - resuscitated twice. Briefly required pressors. Now heis on ventilator, unresponsive - off sedation since AM. Found to have EF of 20%. Also with acute renal failure - poor urine output, creatinine/BUN rising. PMT consulted for [place].   Assessment: Discussed case with Dr. Roderic Palau - he has spoken with family earlier to discuss CRRT - discussed poor prognosis, but family would like to try CRRT and see how patient does. PMT will stay involved to support family and continue to assist with [place].   Reached out to daughter - conversation brief as she tells me her questions/concerns were all addressed in earlier conversation and she is eager to see how patient does with dialysis. We did discuss his current status - discussed patient remains unresponsive. She tells me that her mother was included in conversation earlier today with interpreter and agrees with plan to move forward with dialysis as they feel this is what the patient would want.   Will plan to follow up next week.   Recommendations/Plan:  Family plans to move forward with CRRT, family had long conversation with Dr. Roderic Palau and interpreter this AM  PMT to follow up next week  Code Status:  DNR - decided earlier and not discussed during our conversation  Prognosis:   Unable to determine - poor prognosis  Discharge Planning:  To Be Determined  Care plan was discussed with RN, Dr. Roderic Palau, and patient's daughter Robert Mcdaniel  Thank you for allowing the Palliative Medicine Team to assist in the care of this patient.   Total Time 20 minutes Prolonged Time Billed  no    The above conversation was completed via telephone due to the visitor restrictions during the COVID-19 pandemic. Thorough chart review and discussion with necessary members of the care team was completed as part of assessment. All issues were discussed and  addressed but no physical exam was performed.    Greater than 50%  of this time was spent counseling and coordinating care related to the above assessment and plan.  Juel Burrow, DNP, [facility] Palliative Medicine Team Team Phone # [phone]  Pager [phone]

## 2019-06-06 NOTE — Procedures (Signed)
Hemodialysis Catheter Insertion Procedure Note Robert Mcdaniel [id number] 07/11/1951  Procedure: Insertion of Hemodialysis Catheter Indications: Dialysis Access   Procedure Details Consent: Risks of procedure as well as the alternatives and risks of each were explained to the (patient/caregiver).  Consent for procedure obtained. Time Out: Verified patient identification, verified procedure, site/side was marked, verified correct patient position, special equipment/implants available, medications/allergies/relevent history reviewed, required imaging and test results available.  Performed  Maximum sterile technique was used including antiseptics, cap, gloves, gown, hand hygiene, mask and sheet. Skin prep: Chlorhexidine; local anesthetic administered Triple lumen hemodialysis catheter was inserted into right femoral vein due to no other available access using the Seldinger technique.  Evaluation Blood flow good Complications: No apparent complications Patient did tolerate procedure well. Chest X-ray ordered to verify placement.  CXR: N/a. Femoral placement    Leory Plowman L Icard 06/06/2019

## 2019-06-06 NOTE — Progress Notes (Signed)
118ml fentanyl wasted from bag into stericycle with Lysbeth Galas, Therapist, sports.

## 2019-06-07 ENCOUNTER — Inpatient Hospital Stay (HOSPITAL_COMMUNITY): Payer: HRSA Program

## 2019-06-07 DIAGNOSIS — U071 COVID-19: Secondary | ICD-10-CM | POA: Diagnosis not present

## 2019-06-07 DIAGNOSIS — D649 Anemia, unspecified: Secondary | ICD-10-CM | POA: Diagnosis not present

## 2019-06-07 DIAGNOSIS — J988 Other specified respiratory disorders: Secondary | ICD-10-CM | POA: Diagnosis not present

## 2019-06-07 DIAGNOSIS — J189 Pneumonia, unspecified organism: Secondary | ICD-10-CM | POA: Diagnosis not present

## 2019-06-07 DIAGNOSIS — N179 Acute kidney failure, unspecified: Secondary | ICD-10-CM | POA: Diagnosis not present

## 2019-06-07 DIAGNOSIS — D696 Thrombocytopenia, unspecified: Secondary | ICD-10-CM | POA: Diagnosis not present

## 2019-06-07 DIAGNOSIS — J9601 Acute respiratory failure with hypoxia: Secondary | ICD-10-CM | POA: Diagnosis not present

## 2019-06-07 LAB — COMPREHENSIVE METABOLIC PANEL
ALT: 83 U/L — ABNORMAL HIGH (ref 0–44)
AST: 95 U/L — ABNORMAL HIGH (ref 15–41)
Albumin: 3.3 g/dL — ABNORMAL LOW (ref 3.5–5.0)
Alkaline Phosphatase: 169 U/L — ABNORMAL HIGH (ref 38–126)
Anion gap: 11 (ref 5–15)
BUN: 77 mg/dL — ABNORMAL HIGH (ref 8–23)
CO2: 26 mmol/L (ref 22–32)
Calcium: 6.7 mg/dL — ABNORMAL LOW (ref 8.9–10.3)
Chloride: 100 mmol/L (ref 98–111)
Creatinine, Ser: 3.35 mg/dL — ABNORMAL HIGH (ref 0.61–1.24)
GFR calc Af Amer: 21 mL/min — ABNORMAL LOW (ref >60)
GFR calc non Af Amer: 18 mL/min — ABNORMAL LOW (ref >60)
Glucose, Bld: 189 mg/dL — ABNORMAL HIGH (ref 70–99)
Potassium: 5.2 mmol/L — ABNORMAL HIGH (ref 3.5–5.1)
Sodium: 137 mmol/L (ref 135–145)
Total Bilirubin: 0.6 mg/dL (ref 0.3–1.2)
Total Protein: 5.4 g/dL — ABNORMAL LOW (ref 6.5–8.1)

## 2019-06-07 LAB — CBC WITH DIFFERENTIAL/PLATELET
Abs Immature Granulocytes: 0.16 10*3/uL — ABNORMAL HIGH (ref 0.00–0.07)
Basophils Absolute: 0 10*3/uL (ref 0.0–0.1)
Basophils Relative: 0 %
Eosinophils Absolute: 0 10*3/uL (ref 0.0–0.5)
Eosinophils Relative: 0 %
HCT: 26.2 % — ABNORMAL LOW (ref 39.0–52.0)
Hemoglobin: 8.2 g/dL — ABNORMAL LOW (ref 13.0–17.0)
Immature Granulocytes: 1 %
Lymphocytes Relative: 6 %
Lymphs Abs: 0.8 10*3/uL (ref 0.7–4.0)
MCH: 30.1 pg (ref 26.0–34.0)
MCHC: 31.3 g/dL (ref 30.0–36.0)
MCV: 96.3 fL (ref 80.0–100.0)
Monocytes Absolute: 0.4 10*3/uL (ref 0.1–1.0)
Monocytes Relative: 4 %
Neutro Abs: 11 10*3/uL — ABNORMAL HIGH (ref 1.7–7.7)
Neutrophils Relative %: 89 %
Platelets: 113 10*3/uL — ABNORMAL LOW (ref 150–400)
RBC: 2.72 MIL/uL — ABNORMAL LOW (ref 4.22–5.81)
RDW: 15.4 % (ref 11.5–15.5)
WBC: 12.3 10*3/uL — ABNORMAL HIGH (ref 4.0–10.5)
nRBC: 1.4 % — ABNORMAL HIGH (ref 0.0–0.2)

## 2019-06-07 LAB — GLUCOSE, CAPILLARY
Glucose-Capillary: 121 mg/dL — ABNORMAL HIGH (ref 70–99)
Glucose-Capillary: 130 mg/dL — ABNORMAL HIGH (ref 70–99)
Glucose-Capillary: 150 mg/dL — ABNORMAL HIGH (ref 70–99)
Glucose-Capillary: 158 mg/dL — ABNORMAL HIGH (ref 70–99)
Glucose-Capillary: 161 mg/dL — ABNORMAL HIGH (ref 70–99)
Glucose-Capillary: 172 mg/dL — ABNORMAL HIGH (ref 70–99)
Glucose-Capillary: 176 mg/dL — ABNORMAL HIGH (ref 70–99)
Glucose-Capillary: 181 mg/dL — ABNORMAL HIGH (ref 70–99)
Glucose-Capillary: 200 mg/dL — ABNORMAL HIGH (ref 70–99)
Glucose-Capillary: 208 mg/dL — ABNORMAL HIGH (ref 70–99)
Glucose-Capillary: 241 mg/dL — ABNORMAL HIGH (ref 70–99)

## 2019-06-07 LAB — RENAL FUNCTION PANEL
Albumin: 3.3 g/dL — ABNORMAL LOW (ref 3.5–5.0)
Albumin: 3.3 g/dL — ABNORMAL LOW (ref 3.5–5.0)
Anion gap: 9 (ref 5–15)
Anion gap: 8 (ref 5–15)
BUN: 78 mg/dL — ABNORMAL HIGH (ref 8–23)
BUN: 59 mg/dL — ABNORMAL HIGH (ref 8–23)
CO2: 27 mmol/L (ref 22–32)
CO2: 27 mmol/L (ref 22–32)
Calcium: 6.7 mg/dL — ABNORMAL LOW (ref 8.9–10.3)
Calcium: 6.9 mg/dL — ABNORMAL LOW (ref 8.9–10.3)
Chloride: 101 mmol/L (ref 98–111)
Chloride: 101 mmol/L (ref 98–111)
Creatinine, Ser: 3.37 mg/dL — ABNORMAL HIGH (ref 0.61–1.24)
Creatinine, Ser: 2.76 mg/dL — ABNORMAL HIGH (ref 0.61–1.24)
GFR calc Af Amer: 21 mL/min — ABNORMAL LOW (ref >60)
GFR calc Af Amer: 27 mL/min — ABNORMAL LOW (ref >60)
GFR calc non Af Amer: 18 mL/min — ABNORMAL LOW (ref >60)
GFR calc non Af Amer: 23 mL/min — ABNORMAL LOW (ref >60)
Glucose, Bld: 190 mg/dL — ABNORMAL HIGH (ref 70–99)
Glucose, Bld: 223 mg/dL — ABNORMAL HIGH (ref 70–99)
Phosphorus: 4.6 mg/dL (ref 2.5–4.6)
Phosphorus: 4.4 mg/dL (ref 2.5–4.6)
Potassium: 5.2 mmol/L — ABNORMAL HIGH (ref 3.5–5.1)
Potassium: 5.9 mmol/L — ABNORMAL HIGH (ref 3.5–5.1)
Sodium: 137 mmol/L (ref 135–145)
Sodium: 136 mmol/L (ref 135–145)

## 2019-06-07 LAB — D-DIMER, QUANTITATIVE: D-Dimer, Quant: 5.45 ug/mL-FEU — ABNORMAL HIGH (ref 0.00–0.50)

## 2019-06-07 LAB — POCT I-STAT 7, (LYTES, BLD GAS, ICA,H+H)
Acid-base deficit: 3 mmol/L — ABNORMAL HIGH (ref 0.0–2.0)
Bicarbonate: 27.3 mmol/L (ref 20.0–28.0)
Calcium, Ion: 1.04 mmol/L — ABNORMAL LOW (ref 1.15–1.40)
HCT: 25 % — ABNORMAL LOW (ref 39.0–52.0)
Hemoglobin: 8.5 g/dL — ABNORMAL LOW (ref 13.0–17.0)
O2 Saturation: 99 %
Patient temperature: 98.6
Potassium: 5 mmol/L (ref 3.5–5.1)
Sodium: 136 mmol/L (ref 135–145)
TCO2: 30 mmol/L (ref 22–32)
pCO2 arterial: 82.2 mmHg (ref 32.0–48.0)
pH, Arterial: 7.129 — CL (ref 7.350–7.450)
pO2, Arterial: 188 mmHg — ABNORMAL HIGH (ref 83.0–108.0)

## 2019-06-07 LAB — FERRITIN: Ferritin: 2845 ng/mL — ABNORMAL HIGH (ref 24–336)

## 2019-06-07 LAB — HEPARIN LEVEL (UNFRACTIONATED): Heparin Unfractionated: 1.14 IU/mL — ABNORMAL HIGH (ref 0.30–0.70)

## 2019-06-07 LAB — CULTURE, BLOOD (ROUTINE X 2)
Culture: NO GROWTH
Culture: NO GROWTH
Special Requests: ADEQUATE

## 2019-06-07 LAB — TRIGLYCERIDES: Triglycerides: 79 mg/dL (ref <150)

## 2019-06-07 LAB — APTT: aPTT: >200 seconds (ref 24–36)

## 2019-06-07 LAB — C-REACTIVE PROTEIN: CRP: 2.1 mg/dL — ABNORMAL HIGH (ref <1.0)

## 2019-06-07 LAB — MAGNESIUM: Magnesium: 2.4 mg/dL (ref 1.7–2.4)

## 2019-06-07 MED ORDER — FENTANYL BOLUS VIA INFUSION
50.0000 ug | INTRAVENOUS | Status: DC | PRN
  Administered 2019-06-07 – 2019-06-09 (×2): 50 ug via INTRAVENOUS
  Filled 2019-06-07: qty 50

## 2019-06-07 MED ORDER — SODIUM ZIRCONIUM CYCLOSILICATE 10 G PO PACK
10.0000 g | PACK | Freq: Once | ORAL | Status: AC
  Administered 2019-06-07: 10 g via ORAL
  Filled 2019-06-07: qty 1

## 2019-06-07 MED ORDER — INSULIN ASPART 100 UNIT/ML ~~LOC~~ SOLN
0.0000 [IU] | SUBCUTANEOUS | Status: DC
  Administered 2019-06-07: 3 [IU] via SUBCUTANEOUS
  Administered 2019-06-07: 5 [IU] via SUBCUTANEOUS
  Administered 2019-06-07: 3 [IU] via SUBCUTANEOUS
  Administered 2019-06-08 (×3): 8 [IU] via SUBCUTANEOUS
  Administered 2019-06-08: 5 [IU] via SUBCUTANEOUS
  Administered 2019-06-08 (×2): 8 [IU] via SUBCUTANEOUS
  Administered 2019-06-09: 3 [IU] via SUBCUTANEOUS
  Administered 2019-06-09: 8 [IU] via SUBCUTANEOUS

## 2019-06-07 MED ORDER — MIDAZOLAM HCL 2 MG/2ML IJ SOLN
1.0000 mg | INTRAMUSCULAR | Status: DC | PRN
  Administered 2019-06-07 – 2019-06-09 (×3): 2 mg via INTRAVENOUS
  Filled 2019-06-07 (×2): qty 2

## 2019-06-07 MED ORDER — FENTANYL CITRATE (PF) 100 MCG/2ML IJ SOLN: 50.0000 ug | Freq: Once | INTRAMUSCULAR | Status: DC

## 2019-06-07 MED ORDER — FENTANYL CITRATE (PF) 2500 MCG/50ML IJ SOLN: 0.0000 ug/h | INTRAMUSCULAR | Status: DC

## 2019-06-07 MED ORDER — ROCURONIUM BROMIDE 10 MG/ML (PF) SYRINGE
80.0000 mg | PREFILLED_SYRINGE | Freq: Once | INTRAVENOUS | Status: AC
  Administered 2019-06-07: 18:00:00 80 mg via INTRAVENOUS

## 2019-06-07 MED ORDER — CALCIUM GLUCONATE-NACL 1-0.675 GM/50ML-% IV SOLN
1.0000 g | Freq: Once | INTRAVENOUS | Status: AC
  Administered 2019-06-07: 1000 mg via INTRAVENOUS
  Filled 2019-06-07: qty 50

## 2019-06-07 MED ORDER — INSULIN GLARGINE 100 UNIT/ML ~~LOC~~ SOLN
10.0000 [IU] | Freq: Two times a day (BID) | SUBCUTANEOUS | Status: DC
  Administered 2019-06-07 – 2019-06-08 (×3): 10 [IU] via SUBCUTANEOUS
  Filled 2019-06-07 (×4): qty 0.1

## 2019-06-07 MED ORDER — ROCURONIUM BROMIDE 10 MG/ML (PF) SYRINGE
PREFILLED_SYRINGE | INTRAVENOUS | Status: AC
  Administered 2019-06-07: 18:00:00 80 mg via INTRAVENOUS
  Filled 2019-06-07: qty 10

## 2019-06-07 MED ORDER — PRISMASOL BGK 0/2.5 32-2.5 MEQ/L IV SOLN
INTRAVENOUS | Status: DC
  Administered 2019-06-07 – 2019-06-09 (×3): via INTRAVENOUS_CENTRAL
  Filled 2019-06-07 (×3): qty 5000

## 2019-06-07 MED ORDER — MIDAZOLAM HCL 2 MG/2ML IJ SOLN
INTRAMUSCULAR | Status: AC
  Administered 2019-06-07: 2 mg via INTRAVENOUS
  Filled 2019-06-07: qty 2

## 2019-06-07 MED ORDER — FENTANYL CITRATE (PF) 2500 MCG/50ML IJ SOLN
50.0000 ug/h | INTRAMUSCULAR | Status: DC
  Administered 2019-06-07: 150 ug/h via INTRAVENOUS
  Administered 2019-06-08 (×3): 200 ug/h via INTRAVENOUS
  Filled 2019-06-07 (×5): qty 50

## 2019-06-07 MED ORDER — PROPOFOL 1000 MG/100ML IV EMUL
5.0000 ug/kg/min | INTRAVENOUS | Status: DC
  Administered 2019-06-07 – 2019-06-08 (×2): 10 ug/kg/min via INTRAVENOUS
  Filled 2019-06-07 (×4): qty 100

## 2019-06-07 NOTE — Progress Notes (Signed)
NAME:  Robert Mcdaniel, MRN:  [id number], DOB:  [birth year], LOS: 5 ADMISSION DATE:  06/05/2019, CONSULTATION DATE: July 27 REFERRING MD: [place] Pink, CHIEF COMPLAINT: Dyspnea  Brief History   68 year old male admitted on July 20 with ARDS from COVID-19 pneumonia.  Had a cardiac arrest on July 29 in setting of hypoxemia.  Past Medical History  Hypertension Hyperlipidemia DM2 CAD  Significant Hospital Events   7/27 admission to [facility] 7/28 cardiac arrest, off o2, PEA, 6 mins  7/30 off sedation, no response to painful stimuli 7/31 started CVVHD  Consults:  Pulmonary critical care  Procedures:  7/28 CPR - 2 rounds EPI, 1 amp bicarb, ROSC  7/28 Intubation  7/28 CVC RIJ 7/31 HD cath R fem >   Significant Diagnostic Tests:  7/28 Echo> LVEF 20 to 25% with global hypokinesis, low normal RV systolic function, pacer wires in 7/30 CT head - NAICP 7/31 renal ultrasound> no hydro  Micro Data:  7/27 SARS-COV-2 > positive  Antimicrobials:  7/27 remdesivir>  7/27 decadron>  7/27 tocilizumab   Interim history/subjective:  Started CVVHD yesterday  Objective   Blood pressure (!) 101/47, pulse 74, temperature (!) 97.3 F (36.3 C), temperature source Esophageal, resp. rate 15, height 5' (1.524 m), weight 82.7 kg, SpO2 95 %. CVP:  [13 mmHg-21 mmHg] 13 mmHg  Vent Mode: PRVC FiO2 (%):  [60 %-100 %] 60 % Set Rate:  [35 bmp] 35 bmp Vt Set:  [400 mL] 400 mL PEEP:  [16 cmH20-18 cmH20] 18 cmH20 Plateau Pressure:  [23 cmH20-35 cmH20] 23 cmH20   Intake/Output Summary (Last 24 hours) at 06/07/2019 1536 Last data filed at 06/07/2019 1400 Gross per 24 hour  Intake 2070.92 ml  Output 3370 ml  Net -1299.08 ml   Filed Weights   06/05/19 0500 06/06/19 0500 06/07/19 0500  Weight: 82 kg 82.9 kg 82.7 kg    Examination:  General:  In bed on vent HENT: NCAT ETT in place PULM: CTA B, vent supported breathing CV: RRR, no mgr GI: BS+, soft, nontender MSK: normal bulk and tone Neuro: sedated on vent  July 31 chest x-ray images independently reviewed showing severe bilateral airspace disease, cardiomegaly, 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  Resolved Hospital Problem list     Assessment & Plan:  ARDS due to COVID pneumonia No proning in setting of recent cardiac arrest, high cardiac instability Continue mechanical ventilation per ARDS protocol Target TVol 6-8cc/kgIBW Target Plateau Pressure < 30cm H20 Target driving pressure less than 15 cm of water Target PaO2 55-65: titrate PEEP/FiO2 per protocol As long as PaO2 to FiO2 ratio is less than 1:150 position in prone position for 16 hours a day Check CVP daily if CVL in place Target CVP less than 4, diurese as necessary Ventilator associated pneumonia prevention protocol Complete remdesivir x5 days, decadron 10 days Volume remove with CVVHD  AKI CVVHD per renal, volume removal Monitor BMET and UOP Replace electrolytes as needed  Acute encephalopathy: minimal needs since cardiac arrest, many potential causes including uremia Continue cvvhd for 72 hours then re-assess May need EEG  Cardiogenic shock, resolved  Systolic heart faiulre Monitor hemodynamics Levophed as needed for MAP > 65  Goals of care: will continue CVVHD through Monday then re-assess mental status  Best practice:  Diet: tube feeding Pain/Anxiety/Delirium protocol (if indicated): minimize fentanyl infusion, RASS target 0 VAP protocol (if indicated): yes DVT prophylaxis: sub q hep GI prophylaxis: famotidine Glucose control: SSI Mobility: bed rest Code Status: DNR Family Communication: will discuss with TRH Disposition:  remain in ICU  Labs   CBC: Recent Labs  Lab 06/03/19 0550 06/03/19 1547  06/04/19 0530  06/05/19 0325 06/06/19 0401 06/06/19 0450 06/06/19 2309 06/07/19 0456 06/07/19 0540  WBC 6.3 7.2  --  13.2*  --  13.2*  --  12.3*  --   --  12.3*  NEUTROABS 5.5  --   --  11.7*  --  12.0*  --  10.9*  --   --  11.0*  HGB 12.2* 11.5*   < > 10.6*    < > 11.1* 9.9* 9.0* 8.2* 8.5* 8.2*  HCT 36.7* 36.6*   < > 33.1*   < > 34.3* 29.0* 28.9* 24.0* 25.0* 26.2*  MCV 89.3 96.6  --  92.5  --  92.7  --  96.0  --   --  96.3  PLT 156 121*  --  175  --  156  --  154  --   --  113*   < > = values in this interval not displayed.    Basic Metabolic Panel: Recent Labs  Lab 06/04/19 1500  06/04/19 1800 06/05/19 0325 06/05/19 1717  06/06/19 0450 06/06/19 2139 06/06/19 2309 06/07/19 0456 06/07/19 0540  NA  --    < >  --  139 135   < > 135 140 137 136 137  137  K  --    < >  --  3.8 5.5*   < > 5.9* 5.1  5.1 4.8 5.0 5.2*  5.2*  CL  --   --   --  98 94*  --  95* 100  --   --  101  100  CO2  --   --   --  28 25  --  26 26  --   --  27  26  GLUCOSE  --   --   --  271* 505*  --  461* 133*  --   --  190*  189*  BUN  --   --   --  77* 88*  --  100* 100*  --   --  78*  77*  CREATININE  --   --   --  3.28* 3.93*  --  4.62* 4.76*  --   --  3.37*  3.35*  CALCIUM  --   --   --  6.5* 6.1*  --  6.3* 6.8*  --   --  6.7*  6.7*  MG 2.1  --  2.0 2.2 2.3  --   --   --   --   --  2.4  PHOS 6.5*  --  6.3* 6.4* 7.6*  --   --  6.2*  --   --  4.6   < > = values in this interval not displayed.   GFR: Estimated Creatinine Clearance: 19.6 mL/min (A) (by C-G formula based on SCr of 3.35 mg/dL (H)). Recent Labs  Lab 05/17/2019 0055 05/24/2019 0311 05/13/2019 0413 06/03/19 0550 06/03/19 1547 06/03/19 2045 06/04/19 0530 06/05/19 0325 06/06/19 0450 06/07/19 0540  PROCALCITON  --   --  0.72 0.40  --   --  1.93  --   --   --   WBC 6.1  --   --  6.3 7.2  --  13.2* 13.2* 12.3* 12.3*  LATICACIDVEN 2.3* 1.2  --   --  >11.0* 1.6  --   --   --   --     Liver Function Tests: Recent Labs  Lab  06/03/19 1547 06/04/19 0530 06/05/19 0325 06/05/19 1717 06/06/19 0450 06/06/19 2139 06/07/19 0540  AST 249* 501* 235*  --  85*  --  95*  ALT 176* 198* 158*  --  93*  --  83*  ALKPHOS 145* 181* 195*  --  164*  --  169*  BILITOT 0.7 0.6 0.5  --  0.7  --  0.6  PROT 5.5*  5.2* 5.2*  --  5.8*  --  5.4*  ALBUMIN 2.3* 2.2* 2.3* 2.8* 3.6 3.3* 3.3*  3.3*   No results for input(s): LIPASE, AMYLASE in the last 168 hours. No results for input(s): AMMONIA in the last 168 hours.  ABG    Component Value Date/Time   PHART 7.129 (LL) 06/07/2019 0456   PCO2ART 82.2 (HH) 06/07/2019 0456   PO2ART 188.0 (H) 06/07/2019 0456   HCO3 27.3 06/07/2019 0456   TCO2 30 06/07/2019 0456   ACIDBASEDEF 3.0 (H) 06/07/2019 0456   O2SAT 99.0 06/07/2019 0456     Coagulation Profile: Recent Labs  Lab 05/29/2019 0055 06/03/19 1547  INR 1.0 1.2    Cardiac Enzymes: No results for input(s): CKTOTAL, CKMB, CKMBINDEX, TROPONINI in the last 168 hours.  HbA1C: Hgb A1c MFr Bld  Date/Time Value Ref Range Status  06/03/2019 05:50 AM 13.8 (H) 4.8 - 5.6 % Final    Comment:    (NOTE) Pre diabetes:          5.7%-6.4% Diabetes:              >6.4% Glycemic control for   <7.0% adults with diabetes     CBG: Recent Labs  Lab 06/07/19 0430 06/07/19 0535 06/07/19 0638 06/07/19 0747 06/07/19 1233  GLUCAP 161* 176* 172* 208* 181*       Critical care time: 35 minutes      Roselie Awkward, MD [facility] Pager: [phone] Cell: [phone] If no response, call [phone]

## 2019-06-07 NOTE — Progress Notes (Signed)
[facility]  Worsening ventilator dyssynchrony High vent settings, now needs 100% FiO2 1 dose rocuronium now Start propofol infusion for vent synchrony  Roselie Awkward, MD [place] Pager: [phone] Cell: [phone] If no response, call [phone]

## 2019-06-07 NOTE — Progress Notes (Addendum)
[facility] Progress Note  Name: Robert Mcdaniel MRN: [id number] DOB: 23-Feb-1951  Chief Complaint:  Shortness of breath   Subjective:  Patient was started on CRRT 7/31 after placement of nontunneled dialysis catheter.  He had 1.6 L UF over 7/31 until 7 am 8/1 with CRRT.  His goal was increased from 25 to 50 as tolerated up to 75 to 100 as tolerated as called overnight.  Spoke with nursing.  He is tolerating 100 /hr off.  Patient with difficulty oxygenating.  His FIO2 was 70% and PEEP 18 today; FIO2 decreasing from 100%.  He has not had any issues with clotting.  Started fentanyl last night for dyssynchrony per nursing; prior to that no continuous sedation for a couple of days.  Interim renal US with right kidney 10.3 and left kidney 11.2 cm; no hydro.  He has been anuric.    Review of systems:  Unable to obtain 2/2 mechanical ventilation    Due to the nature of this patient's COVID-19 with isolation and in keeping with efforts to prevent the spread of infection and to conserve personal protective equipment, a physical exam was not personally performed.  Patient's symptoms and exam were discussed in detail with the RN.  A chart review of other providers notes and the patient's lab work as well as review of other pertinent studies was performed.  Exam details from prior documentation were reviewed specifically and confirmed with the bedside nurse.  ------------- Background on consult:  Robert Mcdaniel is a 68 y.o. male with a history of hypertension, diabetes, CAD, hyperlipidemia, and status post pacemaker placement who presented to the hospital with shortness of breath.  He was found to be COVID positive.  He was per report on 15 L of oxygen and reportedly took this off and later collapsed and was found to have PEA arrest with agonal breathing.  Patient was resuscitated.  He had 2 rounds of epi with ROSC then lost pulse again with intubation, received CPR, and regained after 2 minutes  per charting.  Primary team indicates that he may have been somewhat dry on presentation and creatinine had improved from 2.3 down to 1.5 nadir.  He has recently had worsening acute renal failure with creatinine 4.62 this morning and concurrent hyperkalemia and BUN 100.  He had been on IV fluids but was edematous so the team stopped fluids and started Lasix and albumin yesterday.  Work-up is notable for an echo with a new EF of 20-25%.  This was post arrest and was with diffuse LV hypokinesis.  Team is unsure of chronicity of these findings.  Note that he is here from [place] and so there is limited background medical information available.  His course has been complicated by encephalopathy with recent insults of the arrest as well as sedation.  Labs trended as below.  I spoke with his wife and daugther.  His family would want for him to have aggressive measures such as dialysis.  He does occasionally take motrin.  His family states that they were recently told that one of his kidneys was not working well - he had labs about a month ago in [place] and had seen a doctor for his.      Intake/Output Summary (Last 24 hours) at 06/07/2019 1020 Last data filed at 06/07/2019 0900 Gross per 24 hour  Intake 1811.18 ml  Output 2116 ml  Net -304.82 ml    Vitals:  Vitals:   06/07/19 0819 06/07/19 0900 06/07/19 0913 06/07/19 1000  BP:  (!) 100/51  (!) 111/48  Pulse: 60 62 65 67  Resp: (!) 0 (!) 0 (!) 0 (!) 35  Temp: (!) 93 F (33.9 C) (!) 93.9 F (34.4 C) (!) 94.3 F (34.6 C) (!) 95.2 F (35.1 C)  TempSrc: Esophageal     SpO2: 95% 97% 97% 96%  Weight:      Height:         Physical Exam:   General adult male intubated but not proned Eyes - does not open his eyes Lungs diminished and coarse breath sounds  Heart NSR in 60's to 70's Abdomen distended and tight and arasarca per nursing Extremities 2+ upper and 3+ lower extremity edema  Neuro on fentanyl; not following commands or interacting  GU foley  in place Access nontunneled dialysis catheter right femoral    Medications reviewed   Labs:  BMP Latest Ref Rng & Units 06/07/2019 06/07/2019 06/07/2019  Glucose 70 - 99 mg/dL 190(H) 189(H) -  BUN 8 - 23 mg/dL 78(H) 77(H) -  Creatinine 0.61 - 1.24 mg/dL 3.37(H) 3.35(H) -  Sodium 135 - 145 mmol/L 137 137 136  Potassium 3.5 - 5.1 mmol/L 5.2(H) 5.2(H) 5.0  Chloride 98 - 111 mmol/L 101 100 -  CO2 22 - 32 mmol/L 27 26 -  Calcium 8.9 - 10.3 mg/dL 6.7(L) 6.7(L) -    Assessment/Plan:   # AKI  - Secondary to ATN related to arrest.  May have underlying CKD.  CRRT started 7/31.  Renal US with right kidney 10.3 and left kidney 11.2 cm; no hydro and normal echogenicity  - Continue CRRT  - now anuric  - Tolerating 100 ml/hr net negative; team is ok with support of levo to remove fluid - Discontinue bicarb gtt  - increased to 2 liters/hr for clearance  - calcium IV x 1 repeat   # S/p cardiac arrest  - Supportive care per primary team   # Covid 19 infection  - Therapies per pulm and primary team   # Acute hypoxic respiratory failure  - mechanical ventilation per pulmonology   # Hyperkalemia - 2/2 AKI  - Improving  - on 2K for now  - Follow PM labs    # Encephalopathy - s/p arrest - team is weaning sedating medications  # Acute systolic CHF  - UF with CRRT as tolerated   # Anemia - normocyctic - no acute indication for PRBC's   Claudia Desanctis, MD 06/07/2019 10:20 AM

## 2019-06-07 NOTE — Progress Notes (Addendum)
PROGRESS NOTE  Robert Mcdaniel AST:[id number] DOB: Jun 10, 1951 DOA: 05/08/2019  PCP: Patient, No Pcp Per  Brief History/Interval Summary: 68 year old male who speaks only Spanish with a past medical history of coronary artery disease, diabetes mellitus type 2 on insulin, possible chronic kidney disease unspecified stage presented with complains of shortness of breath dry cough.  Patient had nausea vomiting a few days ago.  Patient was found to be positive for COVID-19.  Chest x-ray showed pneumonia.  Patient was hospitalized for further management.  He was hypoxic.    Reason for Visit: Acute respiratory disease due to COVID-19  Consultants: Pulmonology, Nephrology, Palliative Medicine  Procedures:   7/28: Intubated 7/28: Central line placement  7/28 transthoracic echocardiogram  1. The left ventricle has severely reduced systolic function, with an ejection fraction of 20-25%. Left ventricular diffuse hypokinesis.  2. Severe hypokinesis of the left ventricular, entire anterior wall.  3. The inferior vena cava was dilated in size with <50% respiratory variability.   Antibiotics: Anti-infectives (From admission, onward)   Start     Dose/Rate Route Frequency Ordered Stop   06/03/19 1100  remdesivir 100 mg in sodium chloride 0.9 % 250 mL IVPB     100 mg 500 mL/hr over 30 Minutes Intravenous Every 24 hours 05/10/2019 0927 06/06/19 1255   05/17/2019 1100  remdesivir 200 mg in sodium chloride 0.9 % 250 mL IVPB     200 mg 500 mL/hr over 30 Minutes Intravenous Once 05/22/2019 0927 05/14/2019 1124   06/04/2019 0500  azithromycin (ZITHROMAX) 500 mg in sodium chloride 0.9 % 250 mL IVPB  Status:  Discontinued     500 mg 250 mL/hr over 60 Minutes Intravenous Daily 05/25/2019 0416 05/10/2019 1247   06/01/2019 0430  cefTRIAXone (ROCEPHIN) 2 g in sodium chloride 0.9 % 100 mL IVPB  Status:  Discontinued     2 g 200 mL/hr over 30 Minutes Intravenous Daily 05/13/2019 0416 05/31/2019 1247       Subjective/Interval  History: Patient remains unresponsive on vent. Poor urinary output to lasix.     Assessment/Plan:  Acute Hypoxic Resp. Failure due to Acute Covid 19 Viral Illness  COVID-19 Labs  Recent Labs    06/05/19 0325 06/06/19 0450 06/07/19 0540  DDIMER 3.11* 4.00* 5.45*  FERRITIN 7,410* 3,769* 2,845*  CRP 6.8* 4.5* 2.1*    Lab Results  Component Value Date   SARSCOV2NAA POSITIVE (A) 05/10/2019   SARSCOV2NAA Detected (A) 05/26/2019     Fever: Afebrile Oxygen requirements: Mechanical ventilation.  70-800% FiO2.  Saturating in the low 90s.   Antibacterials: Was given ceftriaxone and azithromycin in the ED. Not continued. Remdesivir: He has completed a 5 day course Steroids: Dexamethasone changed over to Solu-Medrol 40 mg every 12 hours Diuretics: Not on diuretics on a scheduled basis Actemra: 1 dose given on 7/27 DVT Prophylaxis: On [place] heparin  Research Studies: Not enrolled into any research studies yet.  Patient intubated sedated.  Pulmonology is following and managing.  Prognosis is poor due to his significantly acute systolic CHF.  From a COVID-19 standpoint he has completed a course of Remdesivir and is still on steroids.  Inflammatory markers were trending down.   Acute systolic CHF/cardiac arrest EF is around 20%.  Patient's cardiac arrest likely precipitated by hypoxia in the setting of congestive heart failure.  He had to be resuscitated twice.  He was given epinephrine bicarbonate and calcium chloride.   No ACE inhibitor is due to renal failure, shock.  Currently on norepinephrine infusion for  blood pressure support.  Volume removal with CRRT  Acute renal failure Baseline renal function is not known.  Patient could have CKD based on his history however this is unspecified.  Initially there was concern for hypovolemia.  Patient was initially given IV fluids with improvement in renal function.  However he went into cardiac arrest as discussed above.  Subsequently, creatinine  began to decline.  He received a trial of intravenous Lasix, without any significant urine output and resulted in worsening renal function.  Renal ultrasound did not show any obstructive process.  Discussed with family and nephrology and patient was started on CRRT on 7/31  Transaminitis Significant bump in his AST and ALT are due to shock.  Trending down.  Continue to monitor.  History of diabetes mellitus type 2, uncontrolled with hyperglycemia/DKA History of poorly controlled diabetes with A1c of 13.8.  Had elevated anion gap on admission likely indicating some degree of DKA.  He has been intermittently requiring insulin infusion for severe hyperglycemia.  Overnight, his blood sugars have improved and he was taken off insulin infusion.  Will start on basal insulin and sliding scale.  History of coronary artery disease status post CABG EKG showed nonspecific T wave changes.  No old EKG available for comparison.  High-sensitivity troponin levels not significantly elevated.  And then of course he had cardiac arrest as discussed above.  Continue aspirin.  Will use beta-blockade once his hemodynamics have improved.     Thrombocytopenia. Unclear etiology.  Likely related to acute infectious process.  Continue to follow.  May need to discontinue heparin with dialysis if platelets continue to decline.  No signs of bleeding at this time.  Anemia of critical illness. No signs of bleeding at this time.  He likely has some degree of anemia of chronic disease.  Continue to follow and transfuse for hemoglobin less than 7.  Essential hypertension Currently hypotensive.  Obesity  Estimated body mass index is 35.61 kg/m as calculated from the following:   Height as of this encounter: 5' (1.524 m).   Weight as of this encounter: 82.7 kg.  Goals of care Considering that patient has had 2 episodes of cardiac arrest requiring CPR, and with his significant CHF with a EF only of 20% the chances of meaningful  recovery if he has another episode of cardiac arrest is extremely low.  Discussed with Dr. Valeta Harms with critical care medicine who agrees.  Patient made DNR and family was informed.  Long discussion with patient's family, explaining his multiple comorbidities, and current decompensation.  I explained that placing him on CRRT would be an option, although with his comorbidities, there is a fair chance that he may not survive this current illness.  If CRRT is started, his recovery would likely be prolonged.  They wish to continue with current treatments.  Palliative care also involved in conversations with family.    DVT Prophylaxis: heparin PUD Prophylaxis: Famotidine Code Status: DNR Family Communication: discussed with daughter 8/1 Disposition Plan: Continue ICU monitoring for now   Medications:  Scheduled: . aspirin  81 mg Oral Daily  . chlorhexidine gluconate (MEDLINE KIT)  15 mL Mouth Rinse BID  . Chlorhexidine Gluconate Cloth  6 each Topical Q0600  . fentaNYL (SUBLIMAZE) injection  50 mcg Intravenous Once  . heparin injection (subcutaneous)  7,500 Units Subcutaneous Q8H  . insulin aspart  0-15 Units Subcutaneous Q4H  . insulin glargine  10 Units Subcutaneous BID  . mouth rinse  15 mL Mouth Rinse 10  times per day  . methylPREDNISolone (SOLU-MEDROL) injection  40 mg Intravenous Q12H  . multivitamin with minerals  1 tablet Oral Daily   Continuous: .  prismasol BGK 4/2.5 300 mL/hr at 06/07/19 1246  . famotidine (PEPCID) IV 20 mg (06/07/19 1007)  . feeding supplement (VITAL AF 1.2 CAL) 1,000 mL (06/07/19 0710)  . fentaNYL infusion INTRAVENOUS 150 mcg/hr (06/07/19 1600)  . heparin 10,000 units/ 20 mL infusion syringe 500 Units/hr (06/07/19 0838)  . norepinephrine (LEVOPHED) Adult infusion 7 mcg/min (06/07/19 1311)  . prismasol BGK 2/2.5 replacement solution 400 mL/hr at 06/07/19 0704  . prismasol BGK 4/2.5 2,000 mL/hr at 06/07/19 1630   JJO:ACZYSAYT, heparin, hydrALAZINE, midazolam    Objective:  Vital Signs  Vitals:   06/07/19 1400 06/07/19 1500 06/07/19 1535 06/07/19 1600  BP: (!) 101/47  (!) 118/45 (!) 99/47  Pulse: 76 74 76 77  Resp: (!) 0 15 (!) 35 18  Temp: (!) 97.3 F (36.3 C) (!) 97.3 F (36.3 C) (!) 97.5 F (36.4 C) (!) 97.5 F (36.4 C)  TempSrc: Esophageal Esophageal    SpO2: 95% 95% 97% 95%  Weight:      Height:        Intake/Output Summary (Last 24 hours) at 06/07/2019 1649 Last data filed at 06/07/2019 1600 Gross per 24 hour  Intake 2230.91 ml  Output 3790 ml  Net -1559.09 ml   Filed Weights   06/05/19 0500 06/06/19 0500 06/07/19 0500  Weight: 82 kg 82.9 kg 82.7 kg    General exam: unresponsive Respiratory system: crackles at bases. Respiratory effort normal. Cardiovascular system:RRR. No murmurs, rubs, gallops. Gastrointestinal system: Abdomen is nondistended, soft and nontender. No organomegaly or masses felt. Normal bowel sounds heard. Central nervous system: unresponsive Extremities: bilateral dependent edema Skin: No rashes, lesions or ulcers Psychiatry: Carlene Coria     Lab Results:  Data Reviewed: I have personally reviewed following labs and imaging studies  CBC: Recent Labs  Lab 06/03/19 0550 06/03/19 1547  06/04/19 0530  06/05/19 0325 06/06/19 0401 06/06/19 0450 06/06/19 2309 06/07/19 0456 06/07/19 0540  WBC 6.3 7.2  --  13.2*  --  13.2*  --  12.3*  --   --  12.3*  NEUTROABS 5.5  --   --  11.7*  --  12.0*  --  10.9*  --   --  11.0*  HGB 12.2* 11.5*   < > 10.6*   < > 11.1* 9.9* 9.0* 8.2* 8.5* 8.2*  HCT 36.7* 36.6*   < > 33.1*   < > 34.3* 29.0* 28.9* 24.0* 25.0* 26.2*  MCV 89.3 96.6  --  92.5  --  92.7  --  96.0  --   --  96.3  PLT 156 121*  --  175  --  156  --  154  --   --  113*   < > = values in this interval not displayed.    Basic Metabolic Panel: Recent Labs  Lab 06/04/19 1500  06/04/19 1800 06/05/19 0325 06/05/19 1717  06/06/19 0450 06/06/19 2139 06/06/19 2309 06/07/19 0456 06/07/19 0540   NA  --    < >  --  139 135   < > 135 140 137 136 137  137  K  --    < >  --  3.8 5.5*   < > 5.9* 5.1  5.1 4.8 5.0 5.2*  5.2*  CL  --   --   --  98 94*  --  95* 100  --   --  101  100  CO2  --   --   --  28 25  --  26 26  --   --  27  26  GLUCOSE  --   --   --  271* 505*  --  461* 133*  --   --  190*  189*  BUN  --   --   --  77* 88*  --  100* 100*  --   --  78*  77*  CREATININE  --   --   --  3.28* 3.93*  --  4.62* 4.76*  --   --  3.37*  3.35*  CALCIUM  --   --   --  6.5* 6.1*  --  6.3* 6.8*  --   --  6.7*  6.7*  MG 2.1  --  2.0 2.2 2.3  --   --   --   --   --  2.4  PHOS 6.5*  --  6.3* 6.4* 7.6*  --   --  6.2*  --   --  4.6   < > = values in this interval not displayed.    GFR: Estimated Creatinine Clearance: 19.6 mL/min (A) (by C-G formula based on SCr of 3.35 mg/dL (H)).  Liver Function Tests: Recent Labs  Lab 06/03/19 1547 06/04/19 0530 06/05/19 0325 06/05/19 1717 06/06/19 0450 06/06/19 2139 06/07/19 0540  AST 249* 501* 235*  --  85*  --  95*  ALT 176* 198* 158*  --  93*  --  83*  ALKPHOS 145* 181* 195*  --  164*  --  169*  BILITOT 0.7 0.6 0.5  --  0.7  --  0.6  PROT 5.5* 5.2* 5.2*  --  5.8*  --  5.4*  ALBUMIN 2.3* 2.2* 2.3* 2.8* 3.6 3.3* 3.3*  3.3*     Coagulation Profile: Recent Labs  Lab 05/07/2019 0055 06/03/19 1547  INR 1.0 1.2     CBG: Recent Labs  Lab 06/07/19 0535 06/07/19 0638 06/07/19 0747 06/07/19 1233 06/07/19 1609  GLUCAP 176* 172* 208* 181* 200*    Anemia Panel: Recent Labs    06/06/19 0450 06/07/19 0540  FERRITIN 3,769* 2,845*    Recent Results (from the past 240 hour(s))  Culture, blood (Routine x 2)     Status: None   Collection Time: 06/03/2019 12:55 AM   Specimen: BLOOD  Result Value Ref Range Status   Specimen Description   Final    BLOOD LEFT ANTECUBITAL Performed at [facility], [place] [address] [place], [place] [address]    Special Requests   Final    BOTTLES DRAWN AEROBIC AND ANAEROBIC Blood  Culture results may not be optimal due to an excessive volume of blood received in culture bottles Performed at [place] [address], [place], [place] [address]    Culture   Final    NO GROWTH 5 DAYS Performed at [facility], [place] [address] [place], [place] [address]    Report Status 06/07/2019 FINAL  Final  Culture, blood (Routine x 2)     Status: None   Collection Time: 05/28/2019 12:59 AM   Specimen: BLOOD RIGHT FOREARM  Result Value Ref Range Status   Specimen Description   Final    BLOOD RIGHT FOREARM Performed at [facility], [place] [address], [place], [place] [address]    Special Requests   Final    BOTTLES DRAWN AEROBIC AND ANAEROBIC Blood  Culture adequate volume Performed at [place] [address], [place], [place] [address]    Culture   Final    NO GROWTH 5 DAYS Performed at [facility], [place] [address], [place], [place] [address]    Report Status 06/07/2019 FINAL  Final  SARS Coronavirus 2 (CEPHEID - Performed in [place] hospital lab), Hosp Order     Status: Abnormal   Collection Time: 05/21/2019  3:52 AM   Specimen: Nasopharyngeal Swab  Result Value Ref Range Status   SARS Coronavirus 2 POSITIVE (A) NEGATIVE Final    Comment: RESULT CALLED TO, READ BACK BY AND VERIFIED WITH: S.BINGHAM RN AT 6767 ON 06/04/2019 BY S.VANHOORNE (NOTE) If result is NEGATIVE SARS-CoV-2 target nucleic acids are NOT DETECTED. The SARS-CoV-2 RNA is generally detectable in upper and lower  respiratory specimens during the acute phase of infection. The lowest  concentration of SARS-CoV-2 viral copies this assay can detect is 250  copies / mL. A negative result does not preclude SARS-CoV-2 infection  and should not be used as the sole basis for treatment or other  patient management decisions.  A negative result may occur with  improper specimen collection / handling, submission of specimen other  than nasopharyngeal swab,  presence of viral mutation(s) within the  areas targeted by this assay, and inadequate number of viral copies  (<250 copies / mL). A negative result must be combined with clinical  observations, patient history, and epidemiological information. If result is POSITIVE SARS-CoV-2 target nucleic acids are  DETECTED. The SARS-CoV-2 RNA is generally detectable in upper and lower  respiratory specimens during the acute phase of infection.  Positive  results are indicative of active infection with SARS-CoV-2.  Clinical  correlation with patient history and other diagnostic information is  necessary to determine patient infection status.  Positive results do  not rule out bacterial infection or co-infection with other viruses. If result is PRESUMPTIVE POSTIVE SARS-CoV-2 nucleic acids MAY BE PRESENT.   A presumptive positive result was obtained on the submitted specimen  and confirmed on repeat testing.  While 2019 novel coronavirus  (SARS-CoV-2) nucleic acids may be present in the submitted sample  additional confirmatory testing may be necessary for epidemiological  and / or clinical management purposes  to differentiate between  SARS-CoV-2 and other Sarbecovirus currently known to infect humans.  If clinically indicated additional testing with an alternate test  methodology  [phone]) is advised. The SARS-CoV-2 RNA is generally  detectable in upper and lower respiratory specimens during the acute  phase of infection. The expected result is Negative. Fact Sheet for Patients:  StrictlyIdeas.no Fact Sheet for Healthcare Providers: BankingDealers.co.za This test is not yet approved or cleared by the [place] FDA and has been authorized for detection and/or diagnosis of SARS-CoV-2 by FDA under an Emergency Use Authorization (EUA).  This EUA will remain in effect (meaning this test can be used) for the duration of the COVID-19 declaration under  Section 564(b)(1) of the Act, 21 U.S.C. section 360bbb-3(b)(1), unless the authorization is terminated or revoked sooner. Performed at [facility], [place] [address], [place], [place] [address]   MRSA PCR Screening     Status: None   Collection Time: 05/07/2019  4:54 PM   Specimen: Nasal Mucosa; Nasopharyngeal  Result Value Ref Range Status   MRSA by PCR NEGATIVE NEGATIVE Final    Comment:        The GeneXpert MRSA Assay (FDA approved for NASAL  specimens only), is one component of a comprehensive MRSA colonization surveillance program. It is not intended to diagnose MRSA infection nor to guide or monitor treatment for MRSA infections. Performed at [facility], [place] [address], [place], [place] [address]       Radiology Studies: US Renal  Result Date: 06/07/2019 CLINICAL DATA:  Acute renal failure. EXAM: RENAL / URINARY TRACT ULTRASOUND COMPLETE COMPARISON:  None. FINDINGS: Right Kidney: Renal measurements: 10.3 x 5.7 x 6.5 cm = volume: 200.7 mL . Echogenicity within normal limits. No mass or hydronephrosis visualized. Mild perinephric fluid. Left Kidney: Renal measurements: 11.2 x 6.0 x 6.0 cm = volume: 210.5 mL. Echogenicity within normal limits. No mass or hydronephrosis visualized. Mild perinephric fluid. Bladder: Decompressed with Foley catheter. IMPRESSION: Small amount of perinephric fluid bilaterally. No hydronephrosis. Electronically Signed   By: Lovey Newcomer M.D.   On: 06/07/2019 08:34   Dg Chest Port 1 View  Result Date: 06/06/2019 CLINICAL DATA:  ARDS and hypoxia EXAM: PORTABLE CHEST 1 VIEW COMPARISON:  June 04, 2019 FINDINGS: There is cardiomegaly. Endotracheal tube is seen with the tip 1.7 cm above the level of the carina. The NG tube is coiled within the stomach. A left-sided pacemaker seen with the lead tips in the right atrium and right ventricle. Again noted are patchy ground-glass opacities throughout both lungs. This is not  significantly changed since the prior. IMPRESSION: 1. No significant change in the bilateral airspace opacities. 2. Cardiomegaly 3. Lines and tubes in appropriate position. Electronically Signed   By: Prudencio Pair M.D.   On: 06/06/2019 23:37    Critical care time: 38mns   LOS: 5 days   [email or web address] on [email or web address]  06/07/2019, 4:49 PM

## 2019-06-07 NOTE — Progress Notes (Signed)
DNR ID band placed with 2 RN verification: Daralene Milch, RN. / Georgeanna Harrison, RN.

## 2019-06-07 NOTE — Progress Notes (Signed)
Updated daughter via phone at 0815 and during video call at 1000. Valinda Party

## 2019-06-07 NOTE — Progress Notes (Signed)
Assisted tele visit to patient with daughter.  Shayann Garbutt Ann, RN  

## 2019-06-07 DEATH — deceased

## 2019-06-08 DIAGNOSIS — J80 Acute respiratory distress syndrome: Secondary | ICD-10-CM | POA: Diagnosis not present

## 2019-06-08 DIAGNOSIS — J9601 Acute respiratory failure with hypoxia: Secondary | ICD-10-CM | POA: Diagnosis not present

## 2019-06-08 DIAGNOSIS — D649 Anemia, unspecified: Secondary | ICD-10-CM | POA: Diagnosis not present

## 2019-06-08 DIAGNOSIS — N179 Acute kidney failure, unspecified: Secondary | ICD-10-CM | POA: Diagnosis not present

## 2019-06-08 DIAGNOSIS — U071 COVID-19: Secondary | ICD-10-CM | POA: Diagnosis not present

## 2019-06-08 LAB — POCT I-STAT 7, (LYTES, BLD GAS, ICA,H+H)
Acid-base deficit: 2 mmol/L (ref 0.0–2.0)
Bicarbonate: 26.4 mmol/L (ref 20.0–28.0)
Calcium, Ion: 1.06 mmol/L — ABNORMAL LOW (ref 1.15–1.40)
HCT: 25 % — ABNORMAL LOW (ref 39.0–52.0)
Hemoglobin: 8.5 g/dL — ABNORMAL LOW (ref 13.0–17.0)
O2 Saturation: 91 %
Patient temperature: 35.9
Potassium: 5 mmol/L (ref 3.5–5.1)
Sodium: 136 mmol/L (ref 135–145)
TCO2: 28 mmol/L (ref 22–32)
pCO2 arterial: 65.5 mmHg (ref 32.0–48.0)
pH, Arterial: 7.207 — ABNORMAL LOW (ref 7.350–7.450)
pO2, Arterial: 71 mmHg — ABNORMAL LOW (ref 83.0–108.0)

## 2019-06-08 LAB — GLUCOSE, CAPILLARY
Glucose-Capillary: 230 mg/dL — ABNORMAL HIGH (ref 70–99)
Glucose-Capillary: 252 mg/dL — ABNORMAL HIGH (ref 70–99)
Glucose-Capillary: 273 mg/dL — ABNORMAL HIGH (ref 70–99)
Glucose-Capillary: 276 mg/dL — ABNORMAL HIGH (ref 70–99)
Glucose-Capillary: 279 mg/dL — ABNORMAL HIGH (ref 70–99)
Glucose-Capillary: 283 mg/dL — ABNORMAL HIGH (ref 70–99)

## 2019-06-08 LAB — RENAL FUNCTION PANEL
Albumin: 3.3 g/dL — ABNORMAL LOW (ref 3.5–5.0)
Albumin: 3.2 g/dL — ABNORMAL LOW (ref 3.5–5.0)
Anion gap: 8 (ref 5–15)
Anion gap: 13 (ref 5–15)
BUN: 51 mg/dL — ABNORMAL HIGH (ref 8–23)
BUN: 45 mg/dL — ABNORMAL HIGH (ref 8–23)
CO2: 25 mmol/L (ref 22–32)
CO2: 22 mmol/L (ref 22–32)
Calcium: 7.1 mg/dL — ABNORMAL LOW (ref 8.9–10.3)
Calcium: 7.3 mg/dL — ABNORMAL LOW (ref 8.9–10.3)
Chloride: 101 mmol/L (ref 98–111)
Chloride: 102 mmol/L (ref 98–111)
Creatinine, Ser: 2.31 mg/dL — ABNORMAL HIGH (ref 0.61–1.24)
Creatinine, Ser: 1.9 mg/dL — ABNORMAL HIGH (ref 0.61–1.24)
GFR calc Af Amer: 33 mL/min — ABNORMAL LOW (ref >60)
GFR calc Af Amer: 42 mL/min — ABNORMAL LOW (ref >60)
GFR calc non Af Amer: 29 mL/min — ABNORMAL LOW (ref >60)
GFR calc non Af Amer: 36 mL/min — ABNORMAL LOW (ref >60)
Glucose, Bld: 289 mg/dL — ABNORMAL HIGH (ref 70–99)
Glucose, Bld: 260 mg/dL — ABNORMAL HIGH (ref 70–99)
Phosphorus: 3.6 mg/dL (ref 2.5–4.6)
Phosphorus: 3.7 mg/dL (ref 2.5–4.6)
Potassium: 5.1 mmol/L (ref 3.5–5.1)
Potassium: 5 mmol/L (ref 3.5–5.1)
Sodium: 134 mmol/L — ABNORMAL LOW (ref 135–145)
Sodium: 137 mmol/L (ref 135–145)

## 2019-06-08 LAB — CBC WITH DIFFERENTIAL/PLATELET
Abs Immature Granulocytes: 0.19 10*3/uL — ABNORMAL HIGH (ref 0.00–0.07)
Basophils Absolute: 0 10*3/uL (ref 0.0–0.1)
Basophils Relative: 0 %
Eosinophils Absolute: 0 10*3/uL (ref 0.0–0.5)
Eosinophils Relative: 0 %
HCT: 25.6 % — ABNORMAL LOW (ref 39.0–52.0)
Hemoglobin: 8 g/dL — ABNORMAL LOW (ref 13.0–17.0)
Immature Granulocytes: 1 %
Lymphocytes Relative: 5 %
Lymphs Abs: 1 10*3/uL (ref 0.7–4.0)
MCH: 30.2 pg (ref 26.0–34.0)
MCHC: 31.3 g/dL (ref 30.0–36.0)
MCV: 96.6 fL (ref 80.0–100.0)
Monocytes Absolute: 0.7 10*3/uL (ref 0.1–1.0)
Monocytes Relative: 4 %
Neutro Abs: 17.1 10*3/uL — ABNORMAL HIGH (ref 1.7–7.7)
Neutrophils Relative %: 90 %
Platelets: 118 10*3/uL — ABNORMAL LOW (ref 150–400)
RBC: 2.65 MIL/uL — ABNORMAL LOW (ref 4.22–5.81)
RDW: 15.6 % — ABNORMAL HIGH (ref 11.5–15.5)
WBC: 19 10*3/uL — ABNORMAL HIGH (ref 4.0–10.5)
nRBC: 5.2 % — ABNORMAL HIGH (ref 0.0–0.2)

## 2019-06-08 LAB — MAGNESIUM: Magnesium: 2.7 mg/dL — ABNORMAL HIGH (ref 1.7–2.4)

## 2019-06-08 LAB — APTT: aPTT: >200 seconds (ref 24–36)

## 2019-06-08 LAB — HEPARIN LEVEL (UNFRACTIONATED): Heparin Unfractionated: >2.2 IU/mL — ABNORMAL HIGH (ref 0.30–0.70)

## 2019-06-08 MED ORDER — INSULIN GLARGINE 100 UNIT/ML ~~LOC~~ SOLN
15.0000 [IU] | Freq: Two times a day (BID) | SUBCUTANEOUS | Status: DC
  Administered 2019-06-08: 15 [IU] via SUBCUTANEOUS
  Filled 2019-06-08 (×2): qty 0.15

## 2019-06-08 MED ORDER — CALCIUM GLUCONATE-NACL 1-0.675 GM/50ML-% IV SOLN
1.0000 g | Freq: Once | INTRAVENOUS | Status: AC
  Administered 2019-06-08: 1000 mg via INTRAVENOUS
  Filled 2019-06-08: qty 50

## 2019-06-08 MED ORDER — POLYETHYLENE GLYCOL 3350 17 G PO PACK
17.0000 g | PACK | Freq: Two times a day (BID) | ORAL | Status: DC
  Administered 2019-06-08 (×2): 17 g via ORAL
  Filled 2019-06-08 (×2): qty 1

## 2019-06-08 MED ORDER — FAMOTIDINE 40 MG/5ML PO SUSR: 20.0000 mg | Freq: Every day | ORAL | Status: DC

## 2019-06-08 NOTE — Progress Notes (Signed)
NAME:  Robert Mcdaniel, MRN:  [id number], DOB:  September 17, 1951, LOS: 6 ADMISSION DATE:  05/10/2019, CONSULTATION DATE: July 27 REFERRING MD: [place] Pink, CHIEF COMPLAINT: Dyspnea  Brief History   68 year old male admitted on July 20 with ARDS from COVID-19 pneumonia.  Had a cardiac arrest on July 29 in setting of hypoxemia.  Past Medical History  Hypertension Hyperlipidemia DM2 CAD  Significant Hospital Events   7/27 admission to [facility] 7/28 cardiac arrest, off o2, PEA, 6 mins  7/30 off sedation, no response to painful stimuli 7/31 started CVVHD 8/1 Severe ventilator dyssynchrony overnight, needed more sedation overnight, not making purposeful movements  Consults:  Pulmonary critical care  Procedures:  7/28 CPR - 2 rounds EPI, 1 amp bicarb, ROSC  7/28 Intubation  7/28 CVC RIJ 7/31 HD cath R fem >   Significant Diagnostic Tests:  7/28 Echo> LVEF 20 to 25% with global hypokinesis, low normal RV systolic function, pacer wires in 7/30 CT head - NAICP 7/31 renal ultrasound> no hydro  Micro Data:  7/27 SARS-COV-2 > positive  Antimicrobials:  7/27 remdesivir>  7/27 decadron>  7/27 tocilizumab   Interim history/subjective:   Severe ventilator dyssynchrony overnight, needed more sedation overnight, not making purposeful movements  Objective   Blood pressure (!) 141/56, pulse 81, temperature 97.9 F (36.6 C), temperature source Rectal, resp. rate (!) 37, height 5' (1.524 m), weight 80.2 kg, SpO2 100 %.    Vent Mode: PRVC FiO2 (%):  [60 %-100 %] 100 % Set Rate:  [35 bmp] 35 bmp Vt Set:  [400 mL] 400 mL PEEP:  [16 cmH20-18 cmH20] 16 cmH20 Plateau Pressure:  [23 cmH20-38 cmH20] 26 cmH20   Intake/Output Summary (Last 24 hours) at 06/08/2019 0800 Last data filed at 06/08/2019 0700 Gross per 24 hour  Intake 2280.76 ml  Output 4642 ml  Net -2361.24 ml   Filed Weights   06/06/19 0500 06/07/19 0500 06/08/19 0330  Weight: 82.9 kg 82.7 kg 80.2 kg    Examination:  General:  In bed on  vent HENT: NCAT ETT in place PULM: CTA B, vent supported breathing CV: RRR, no mgr GI: BS+, soft, nontender MSK: normal bulk and tone Neuro: sedated on vent   July 31 chest x-ray images independently reviewed showing severe bilateral airspace disease, cardiomegaly, 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  Resolved Hospital Problem list     Assessment & Plan:  ARDS due to COVID pneumonia: severe, worsening oxygen needs, now 100% Continue mechanical ventilation per ARDS protocol Target TVol 6-8cc/kgIBW Target Plateau Pressure < 30cm H20 Target driving pressure less than 15 cm of water Target PaO2 55-65: titrate PEEP/FiO2 per protocol As long as PaO2 to FiO2 ratio is less than 1:150 position in prone position for 16 hours a day Check CVP daily if CVL in place Target CVP less than 4, diurese as necessary Ventilator associated pneumonia prevention protocol 8/2 continuing volume removal with CRRT, continue sedation given severe vent dyssynchrony, too unstable to consider prone positioning  AKI Uremia? CVVHD per renal, volume removal again today Monitor BMET and UOP Replace electrolytes as needed Plan reassess goals of care on 8/3 after 72 hours of CVVHD  Acute encephalopathy: minimal needs since cardiac arrest, many potential causes including uremia Too unstable for head imaging Plan to stop sedation if able on 8/3 and re-assess mental status  Cardiogenic shock, resolved  Systolic heart faiulre Continue to monitor hemodynamics   Goals of care: will continue CVVHD through Monday then re-assess mental status  Best practice:  Diet: tube  feeding Pain/Anxiety/Delirium protocol (if indicated): RASS target 0 but also using sedation for vent synchrony, WUA on 8/3 VAP protocol (if indicated): yes DVT prophylaxis: sub q hep GI prophylaxis: famotidine Glucose control: SSI Mobility: bed rest Code Status: DNR Family Communication: will discuss with TRH Disposition: remain in ICU  Labs    CBC: Recent Labs  Lab 06/04/19 0530  06/05/19 0325  06/06/19 0450 06/06/19 2309 06/07/19 0456 06/07/19 0540 06/08/19 0349 06/08/19 0411  WBC 13.2*  --  13.2*  --  12.3*  --   --  12.3*  --  19.0*  NEUTROABS 11.7*  --  12.0*  --  10.9*  --   --  11.0*  --  17.1*  HGB 10.6*   < > 11.1*   < > 9.0* 8.2* 8.5* 8.2* 8.5* 8.0*  HCT 33.1*   < > 34.3*   < > 28.9* 24.0* 25.0* 26.2* 25.0* 25.6*  MCV 92.5  --  92.7  --  96.0  --   --  96.3  --  96.6  PLT 175  --  156  --  154  --   --  113*  --  118*   < > = values in this interval not displayed.    Basic Metabolic Panel: Recent Labs  Lab 06/04/19 1800 06/05/19 0325 06/05/19 1717  06/06/19 0450 06/06/19 2139  06/07/19 0456 06/07/19 0540 06/07/19 1610 06/08/19 0349 06/08/19 0411  NA  --  139 135   < > 135 140   < > 136 137  137 136 136 134*  K  --  3.8 5.5*   < > 5.9* 5.1  5.1   < > 5.0 5.2*  5.2* 5.9* 5.0 5.1  CL  --  98 94*  --  95* 100  --   --  101  100 101  --  101  CO2  --  28 25  --  26 26  --   --  27  26 27   --  25  GLUCOSE  --  271* 505*  --  461* 133*  --   --  190*  189* 223*  --  289*  BUN  --  77* 88*  --  100* 100*  --   --  78*  77* 59*  --  51*  CREATININE  --  3.28* 3.93*  --  4.62* 4.76*  --   --  3.37*  3.35* 2.76*  --  2.31*  CALCIUM  --  6.5* 6.1*  --  6.3* 6.8*  --   --  6.7*  6.7* 6.9*  --  7.1*  MG 2.0 2.2 2.3  --   --   --   --   --  2.4  --   --  2.7*  PHOS 6.3* 6.4* 7.6*  --   --  6.2*  --   --  4.6 4.4  --  3.6   < > = values in this interval not displayed.   GFR: Estimated Creatinine Clearance: 28 mL/min (A) (by C-G formula based on SCr of 2.31 mg/dL (H)). Recent Labs  Lab 05/10/2019 0055 05/24/2019 0311 05/08/2019 0413 06/03/19 0550 06/03/19 1547 06/03/19 2045 06/04/19 0530 06/05/19 0325 06/06/19 0450 06/07/19 0540 06/08/19 0411  PROCALCITON  --   --  0.72 0.40  --   --  1.93  --   --   --   --   WBC 6.1  --   --  6.3 7.2  --  13.2* 13.2* 12.3* 12.3* 19.0*  LATICACIDVEN 2.3* 1.2  --    --  >11.0* 1.6  --   --   --   --   --     Liver Function Tests: Recent Labs  Lab 06/03/19 1547 06/04/19 0530 06/05/19 0325  06/06/19 0450 06/06/19 2139 06/07/19 0540 06/07/19 1610 06/08/19 0411  AST 249* 501* 235*  --  85*  --  95*  --   --   ALT 176* 198* 158*  --  93*  --  83*  --   --   ALKPHOS 145* 181* 195*  --  164*  --  169*  --   --   BILITOT 0.7 0.6 0.5  --  0.7  --  0.6  --   --   PROT 5.5* 5.2* 5.2*  --  5.8*  --  5.4*  --   --   ALBUMIN 2.3* 2.2* 2.3*   < > 3.6 3.3* 3.3*  3.3* 3.3* 3.3*   < > = values in this interval not displayed.   No results for input(s): LIPASE, AMYLASE in the last 168 hours. No results for input(s): AMMONIA in the last 168 hours.  ABG    Component Value Date/Time   PHART 7.207 (L) 06/08/2019 0349   PCO2ART 65.5 (HH) 06/08/2019 0349   PO2ART 71.0 (L) 06/08/2019 0349   HCO3 26.4 06/08/2019 0349   TCO2 28 06/08/2019 0349   ACIDBASEDEF 2.0 06/08/2019 0349   O2SAT 91.0 06/08/2019 0349     Coagulation Profile: Recent Labs  Lab 05/16/2019 0055 06/03/19 1547  INR 1.0 1.2    Cardiac Enzymes: No results for input(s): CKTOTAL, CKMB, CKMBINDEX, TROPONINI in the last 168 hours.  HbA1C: Hgb A1c MFr Bld  Date/Time Value Ref Range Status  06/03/2019 05:50 AM 13.8 (H) 4.8 - 5.6 % Final    Comment:    (NOTE) Pre diabetes:          5.7%-6.4% Diabetes:              >6.4% Glycemic control for   <7.0% adults with diabetes     CBG: Recent Labs  Lab 06/07/19 1233 06/07/19 1609 06/07/19 1949 06/08/19 0033 06/08/19 0344  GLUCAP 181* 200* 241* 283* 279*       Critical care time: 35 minutes      Roselie Awkward, MD [facility] Pager: [phone] Cell: [phone] If no response, call [phone]

## 2019-06-08 NOTE — Progress Notes (Signed)
PROGRESS NOTE  Robert Mcdaniel RSW:[id number] DOB: Apr 08, 1951 DOA: 05/10/2019  PCP: Patient, No Pcp Per  Brief History/Interval Summary: 68 year old male who speaks only Spanish with a past medical history of coronary artery disease, diabetes mellitus type 2 on insulin, possible chronic kidney disease unspecified stage presented with complains of shortness of breath dry cough.  Patient had nausea vomiting a few days ago.  Patient was found to be positive for COVID-19.  Chest x-ray showed pneumonia.  Patient was hospitalized for further management.  He was hypoxic.    Reason for Visit: Acute respiratory disease due to COVID-19  Consultants: Pulmonology, Nephrology, Palliative Medicine  Procedures:   7/28: Intubated 7/28: Central line placement  7/28 transthoracic echocardiogram  1. The left ventricle has severely reduced systolic function, with an ejection fraction of 20-25%. Left ventricular diffuse hypokinesis.  2. Severe hypokinesis of the left ventricular, entire anterior wall.  3. The inferior vena cava was dilated in size with <50% respiratory variability.   Antibiotics: Anti-infectives (From admission, onward)   Start     Dose/Rate Route Frequency Ordered Stop   06/03/19 1100  remdesivir 100 mg in sodium chloride 0.9 % 250 mL IVPB     100 mg 500 mL/hr over 30 Minutes Intravenous Every 24 hours 06/04/2019 0927 06/06/19 1255   05/17/2019 1100  remdesivir 200 mg in sodium chloride 0.9 % 250 mL IVPB     200 mg 500 mL/hr over 30 Minutes Intravenous Once 05/22/2019 0927 05/13/2019 1124   05/23/2019 0500  azithromycin (ZITHROMAX) 500 mg in sodium chloride 0.9 % 250 mL IVPB  Status:  Discontinued     500 mg 250 mL/hr over 60 Minutes Intravenous Daily 05/28/2019 0416 05/29/2019 1247   05/30/2019 0430  cefTRIAXone (ROCEPHIN) 2 g in sodium chloride 0.9 % 100 mL IVPB  Status:  Discontinued     2 g 200 mL/hr over 30 Minutes Intravenous Daily 06/05/2019 0416 06/01/2019 1247       Subjective/Interval  History: Unresponsive on ventilator.  Poor urine output.  Continues on CRRT.  Assessment/Plan:  Acute Hypoxic Resp. Failure due to Acute Covid 19 Viral Illness  COVID-19 Labs  Recent Labs    06/06/19 0450 06/07/19 0540  DDIMER 4.00* 5.45*  FERRITIN 3,769* 2,845*  CRP 4.5* 2.1*    Lab Results  Component Value Date   SARSCOV2NAA POSITIVE (A) 05/23/2019   SARSCOV2NAA Detected (A) 05/26/2019     Fever: Afebrile Oxygen requirements: Mechanical ventilation.  70-800% FiO2.  Saturating in the low 90s.   Antibacterials: Was given ceftriaxone and azithromycin in the ED. Not continued. Remdesivir: He has completed a 5 day course Steroids: Dexamethasone changed over to Solu-Medrol 40 mg every 12 hours Diuretics: Not on diuretics on a scheduled basis Actemra: 1 dose given on 7/27 DVT Prophylaxis: On [place] heparin  Research Studies: Not enrolled into any research studies yet.  Patient intubated sedated.  Pulmonology is following and managing.  Prognosis is poor due to his significantly acute systolic CHF.  From a COVID-19 standpoint he has completed a course of Remdesivir and is still on steroids.  Inflammatory markers were trending down.   Acute systolic CHF/cardiac arrest EF is around 20%.  Patient's cardiac arrest likely precipitated by hypoxia in the setting of congestive heart failure.  He had to be resuscitated twice.  He was given epinephrine bicarbonate and calcium chloride.   No ACE inhibitor is due to renal failure, shock.  Currently on norepinephrine infusion for blood pressure support.  Volume removal with  CRRT  Acute renal failure Baseline renal function is not known.  Patient could have CKD based on his history however this is unspecified.  Initially there was concern for hypovolemia.  Patient was initially given IV fluids with improvement in renal function.  However he went into cardiac arrest as discussed above.  Subsequently, creatinine began to decline.  He received a  trial of intravenous Lasix, without any significant urine output and resulted in worsening renal function.  Renal ultrasound did not show any obstructive process.  Discussed with family and nephrology and patient was started on CRRT on 7/31.  Continue volume removal with CRRT.  Acute encephalopathy. Patient has remained unresponsive.  Initially, he was not requiring any sedation, currently he was requiring fentanyl infusion.  He does not open his eyes, follow commands or make any purposeful movements.  CT head was unrevealing.  Cannot perform MRI due to presence of pacemaker.  Despite stopping benzodiazepines, his mental status has not significantly improved.  He is being dialyzed, but has not had any substantial improvement  Transaminitis Significant bump in his AST and ALT are due to shock.  Trending down.  Continue to monitor.  History of diabetes mellitus type 2, uncontrolled with hyperglycemia/DKA History of poorly controlled diabetes with A1c of 13.8.  Had elevated anion gap on admission likely indicating some degree of DKA.  He has been intermittently requiring insulin infusion for severe hyperglycemia.  He has since been transitioned to subcutaneous insulin.  Blood sugars remain elevated.  Will adjust basal insulin.  Continue sliding scale.  History of coronary artery disease status post CABG EKG showed nonspecific T wave changes.  No old EKG available for comparison.  High-sensitivity troponin levels not significantly elevated.  And then of course he had cardiac arrest as discussed above.  Continue aspirin.  Will use beta-blockade once his hemodynamics have improved.     Thrombocytopenia. Unclear etiology.  Likely related to acute infectious process.  Continue to follow.  May need to discontinue heparin with dialysis if platelets continue to decline.  No signs of bleeding at this time.  Anemia of critical illness. No signs of bleeding at this time.  He likely has some degree of anemia of  chronic disease.  Continue to follow and transfuse for hemoglobin less than 7.  Essential hypertension Currently hypotensive.  Obesity  Estimated body mass index is 34.53 kg/m as calculated from the following:   Height as of this encounter: 5' (1.524 m).   Weight as of this encounter: 80.2 kg.  Goals of care Considering that patient has had 2 episodes of cardiac arrest requiring CPR, and with his significant CHF with a EF only of 20% the chances of meaningful recovery if he has another episode of cardiac arrest is extremely low.  Discussed with Dr. Valeta Harms with critical care medicine who agrees.  Patient made DNR and family was informed.  Long discussion with patient's family, explaining his multiple comorbidities, and current decompensation.  I explained that placing him on CRRT would be an option, although with his comorbidities, there is a fair chance that he still may not survive this current illness.  If CRRT is started, his recovery would likely be prolonged.  They wish to continue with current treatments.  Palliative care also involved in conversations with family.  Despite aggressive measures with mechanical ventilation, CRRT, vasopressors, his overall condition has not substantially improved.  No significant improvement in mental status.  We will continue current treatments for another 24 hours, but if  he does not show any substantial improvement, will likely need to revisit goals of care and consider transitioning towards comfort.    DVT Prophylaxis: heparin PUD Prophylaxis: Famotidine Code Status: DNR Family Communication: discussed with daughter and multiple family members 8/2 Disposition Plan: Continue ICU monitoring for now   Medications:  Scheduled:  aspirin  81 mg Oral Daily   chlorhexidine gluconate (MEDLINE KIT)  15 mL Mouth Rinse BID   Chlorhexidine Gluconate Cloth  6 each Topical Q0600   [START ON 2019/06/27] famotidine  20 mg Per Tube Daily   fentaNYL  (SUBLIMAZE) injection  50 mcg Intravenous Once   insulin aspart  0-15 Units Subcutaneous Q4H   insulin glargine  15 Units Subcutaneous BID   mouth rinse  15 mL Mouth Rinse 10 times per day   methylPREDNISolone (SOLU-MEDROL) injection  40 mg Intravenous Q12H   multivitamin with minerals  1 tablet Oral Daily   polyethylene glycol  17 g Oral BID   Continuous:  feeding supplement (VITAL AF 1.2 CAL) 1,000 mL (06/07/19 0710)   fentaNYL infusion INTRAVENOUS 200 mcg/hr (06/08/19 1308)   heparin 10,000 units/ 20 mL infusion syringe 500 Units/hr (06/08/19 0346)   norepinephrine (LEVOPHED) Adult infusion 7 mcg/min (06/08/19 0700)   prismasol BGK 2/2.5 replacement solution 400 mL/hr at 06/08/19 0912   prismasol BGK 2/2.5 replacement solution 300 mL/hr at 06/08/19 1225   prismasol BGK 4/2.5 2,000 mL/hr at 06/08/19 1346   propofol (DIPRIVAN) infusion 10 mcg/kg/min (06/08/19 0700)   DJS:HFWYOVZC, heparin, hydrALAZINE, midazolam   Objective:  Vital Signs  Vitals:   06/08/19 1400 06/08/19 1500 06/08/19 1526 06/08/19 1600  BP:   (!) 120/43   Pulse: 81 79 79 80  Resp:   (!) 36   Temp: (!) 97 F (36.1 C) (!) 96.8 F (36 C) (!) 96.6 F (35.9 C) (!) 96.6 F (35.9 C)  TempSrc: Rectal     SpO2: 96% 99% 98% 97%  Weight:      Height:        Intake/Output Summary (Last 24 hours) at 06/08/2019 1628 Last data filed at 06/08/2019 1500 Gross per 24 hour  Intake 1162.35 ml  Output 4314 ml  Net -3151.65 ml   Filed Weights   06/06/19 0500 06/07/19 0500 06/08/19 0330  Weight: 82.9 kg 82.7 kg 80.2 kg    General exam: Unresponsive on ventilator Respiratory system: Clear to auscultation. Respiratory effort normal. Cardiovascular system:RRR. No murmurs, rubs, gallops. Gastrointestinal system: Abdomen is nondistended, soft and nontender. No organomegaly or masses felt. Normal bowel sounds heard. Central nervous system: Unable to assess due to mental status Extremities: Dependent edema  bilaterally Skin: No rashes, lesions or ulcers Psychiatry: Unresponsive     Lab Results:  Data Reviewed: I have personally reviewed following labs and imaging studies  CBC: Recent Labs  Lab 06/04/19 0530  06/05/19 0325  06/06/19 0450 06/06/19 2309 06/07/19 0456 06/07/19 0540 06/08/19 0349 06/08/19 0411  WBC 13.2*  --  13.2*  --  12.3*  --   --  12.3*  --  19.0*  NEUTROABS 11.7*  --  12.0*  --  10.9*  --   --  11.0*  --  17.1*  HGB 10.6*   < > 11.1*   < > 9.0* 8.2* 8.5* 8.2* 8.5* 8.0*  HCT 33.1*   < > 34.3*   < > 28.9* 24.0* 25.0* 26.2* 25.0* 25.6*  MCV 92.5  --  92.7  --  96.0  --   --  96.3  --  96.6  PLT 175  --  156  --  154  --   --  113*  --  118*   < > = values in this interval not displayed.    Basic Metabolic Panel: Recent Labs  Lab 06/04/19 1800 06/05/19 0325 06/05/19 1717  06/06/19 0450 06/06/19 2139  06/07/19 0456 06/07/19 0540 06/07/19 1610 06/08/19 0349 06/08/19 0411  NA  --  139 135   < > 135 140   < > 136 137   137 136 136 134*  K  --  3.8 5.5*   < > 5.9* 5.1   5.1   < > 5.0 5.2*   5.2* 5.9* 5.0 5.1  CL  --  98 94*  --  95* 100  --   --  101   100 101  --  101  CO2  --  28 25  --  26 26  --   --  27   26 27   --  25  GLUCOSE  --  271* 505*  --  461* 133*  --   --  190*   189* 223*  --  289*  BUN  --  77* 88*  --  100* 100*  --   --  78*   77* 59*  --  51*  CREATININE  --  3.28* 3.93*  --  4.62* 4.76*  --   --  3.37*   3.35* 2.76*  --  2.31*  CALCIUM  --  6.5* 6.1*  --  6.3* 6.8*  --   --  6.7*   6.7* 6.9*  --  7.1*  MG 2.0 2.2 2.3  --   --   --   --   --  2.4  --   --  2.7*  PHOS 6.3* 6.4* 7.6*  --   --  6.2*  --   --  4.6 4.4  --  3.6   < > = values in this interval not displayed.    GFR: Estimated Creatinine Clearance: 28 mL/min (A) (by C-G formula based on SCr of 2.31 mg/dL (H)).  Liver Function Tests: Recent Labs  Lab 06/03/19 1547 06/04/19 0530 06/05/19 0325  06/06/19 0450 06/06/19 2139 06/07/19 0540 06/07/19 1610 06/08/19 0411    AST 249* 501* 235*  --  85*  --  95*  --   --   ALT 176* 198* 158*  --  93*  --  83*  --   --   ALKPHOS 145* 181* 195*  --  164*  --  169*  --   --   BILITOT 0.7 0.6 0.5  --  0.7  --  0.6  --   --   PROT 5.5* 5.2* 5.2*  --  5.8*  --  5.4*  --   --   ALBUMIN 2.3* 2.2* 2.3*   < > 3.6 3.3* 3.3*   3.3* 3.3* 3.3*   < > = values in this interval not displayed.     Coagulation Profile: Recent Labs  Lab 05/20/2019 0055 06/03/19 1547  INR 1.0 1.2     CBG: Recent Labs  Lab 06/08/19 0033 06/08/19 0344 06/08/19 0752 06/08/19 1149 06/08/19 1601  GLUCAP 283* 279* 276* 273* 252*    Anemia Panel: Recent Labs    06/06/19 0450 06/07/19 0540  FERRITIN 3,769* 2,845*    Recent Results (from the past 240 hour(s))  Culture, blood (Routine x 2)     Status: None  Collection Time: 05/31/2019 12:55 AM   Specimen: BLOOD  Result Value Ref Range Status   Specimen Description   Final    BLOOD LEFT ANTECUBITAL Performed at [facility] [address] [place], [place] [address]    Special Requests   Final    BOTTLES DRAWN AEROBIC AND ANAEROBIC Blood Culture results may not be optimal due to an excessive volume of blood received in culture bottles Performed at [facility] [address], [place], [place] [address]    Culture   Final    NO GROWTH 5 DAYS Performed at [facility], [place] [address], [place], [place] [address]    Report Status 06/07/2019 FINAL  Final  Culture, blood (Routine x 2)     Status: None   Collection Time: 05/15/2019 12:59 AM   Specimen: BLOOD RIGHT FOREARM  Result Value Ref Range Status   Specimen Description   Final    BLOOD RIGHT FOREARM Performed at [facility], [place] [address], [place], [place] [address]    Special Requests   Final    BOTTLES DRAWN AEROBIC AND ANAEROBIC Blood Culture adequate volume Performed at [facility] [address], [place], [place] [address]    Culture   Final     NO GROWTH 5 DAYS Performed at [facility], [place] [address], [place], [place] [address]    Report Status 06/07/2019 FINAL  Final  SARS Coronavirus 2 (CEPHEID - Performed in [place] hospital lab), Hosp Order     Status: Abnormal   Collection Time: 05/10/2019  3:52 AM   Specimen: Nasopharyngeal Swab  Result Value Ref Range Status   SARS Coronavirus 2 POSITIVE (A) NEGATIVE Final    Comment: RESULT CALLED TO, READ BACK BY AND VERIFIED WITH: S.BINGHAM RN AT 0973 ON 05/25/2019 BY S.VANHOORNE (NOTE) If result is NEGATIVE SARS-CoV-2 target nucleic acids are NOT DETECTED. The SARS-CoV-2 RNA is generally detectable in upper and lower  respiratory specimens during the acute phase of infection. The lowest  concentration of SARS-CoV-2 viral copies this assay can detect is 250  copies / mL. A negative result does not preclude SARS-CoV-2 infection  and should not be used as the sole basis for treatment or other  patient management decisions.  A negative result may occur with  improper specimen collection / handling, submission of specimen other  than nasopharyngeal swab, presence of viral mutation(s) within the  areas targeted by this assay, and inadequate number of viral copies  (<250 copies / mL). A negative result must be combined with clinical  observations, patient history, and epidemiological information. If result is POSITIVE SARS-CoV-2 target nucleic acids are  DETECTED. The SARS-CoV-2 RNA is generally detectable in upper and lower  respiratory specimens during the acute phase of infection.  Positive  results are indicative of active infection with SARS-CoV-2.  Clinical  correlation with patient history and other diagnostic information is  necessary to determine patient infection status.  Positive results do  not rule out bacterial infection or co-infection with other viruses. If result is PRESUMPTIVE POSTIVE SARS-CoV-2 nucleic acids MAY BE PRESENT.   A presumptive positive result  was obtained on the submitted specimen  and confirmed on repeat testing.  While 2019 novel coronavirus  (SARS-CoV-2) nucleic acids may be present in the submitted sample  additional confirmatory testing may be necessary for epidemiological  and / or clinical management purposes  to differentiate between  SARS-CoV-2 and other Sarbecovirus  currently known to infect humans.  If clinically indicated additional testing with an alternate test  methodology  [phone]) is advised. The SARS-CoV-2 RNA is generally  detectable in upper and lower respiratory specimens during the acute  phase of infection. The expected result is Negative. Fact Sheet for Patients:  StrictlyIdeas.no Fact Sheet for Healthcare Providers: BankingDealers.co.za This test is not yet approved or cleared by the [place] FDA and has been authorized for detection and/or diagnosis of SARS-CoV-2 by FDA under an Emergency Use Authorization (EUA).  This EUA will remain in effect (meaning this test can be used) for the duration of the COVID-19 declaration under Section 564(b)(1) of the Act, 21 U.S.C. section 360bbb-3(b)(1), unless the authorization is terminated or revoked sooner. Performed at [facility], [place] [address], [place], [place] [address]   MRSA PCR Screening     Status: None   Collection Time: 05/08/2019  4:54 PM   Specimen: Nasal Mucosa; Nasopharyngeal  Result Value Ref Range Status   MRSA by PCR NEGATIVE NEGATIVE Final    Comment:        The GeneXpert MRSA Assay (FDA approved for NASAL specimens only), is one component of a comprehensive MRSA colonization surveillance program. It is not intended to diagnose MRSA infection nor to guide or monitor treatment for MRSA infections. Performed at [facility], [place] [address] [place], [place] [address]       Radiology Studies: US Renal  Result Date: 06/07/2019 CLINICAL DATA:   Acute renal failure. EXAM: RENAL / URINARY TRACT ULTRASOUND COMPLETE COMPARISON:  None. FINDINGS: Right Kidney: Renal measurements: 10.3 x 5.7 x 6.5 cm = volume: 200.7 mL . Echogenicity within normal limits. No mass or hydronephrosis visualized. Mild perinephric fluid. Left Kidney: Renal measurements: 11.2 x 6.0 x 6.0 cm = volume: 210.5 mL. Echogenicity within normal limits. No mass or hydronephrosis visualized. Mild perinephric fluid. Bladder: Decompressed with Foley catheter. IMPRESSION: Small amount of perinephric fluid bilaterally. No hydronephrosis. Electronically Signed   By: Lovey Newcomer M.D.   On: 06/07/2019 08:34   Dg Chest Port 1 View  Result Date: 06/06/2019 CLINICAL DATA:  ARDS and hypoxia EXAM: PORTABLE CHEST 1 VIEW COMPARISON:  June 04, 2019 FINDINGS: There is cardiomegaly. Endotracheal tube is seen with the tip 1.7 cm above the level of the carina. The NG tube is coiled within the stomach. A left-sided pacemaker seen with the lead tips in the right atrium and right ventricle. Again noted are patchy ground-glass opacities throughout both lungs. This is not significantly changed since the prior. IMPRESSION: 1. No significant change in the bilateral airspace opacities. 2. Cardiomegaly 3. Lines and tubes in appropriate position. Electronically Signed   By: Prudencio Pair M.D.   On: 06/06/2019 23:37    Critical care time: 29mns   LOS: 6 days   [email or web address] on [email or web address]  06/08/2019, 4:28 PM

## 2019-06-08 NOTE — Progress Notes (Signed)
Did not turn down sedation for wake up assessment due to vent dysynchrony per md. Etta Quill.RN

## 2019-06-08 NOTE — Progress Notes (Signed)
[facility] Progress Note  Name: Robert Mcdaniel MRN: [id number] DOB: 1951-07-22  Chief Complaint:  Shortness of breath   Subjective:   He had 4.9 liters UF over 8/1 with CRRT.  Post filter fluids changed to 2K in the interim.  Spoke with nursing.  He has been at net negative 100 ml/hr and has been on levo at 6 mcg/min.  He had ventilator dyssynchrony and was started on propofol and received rocuronium 8/1.  FIO2 is up to 90% and PEEP 15.  He has not had any issues with clotting.  His neuro status has declined and they are concerned re: underlying myoclonus.   Review of systems:  Unable to obtain 2/2 mechanical ventilation    Due to the nature of this patient's COVID-19 with isolation and in keeping with efforts to prevent the spread of infection and to conserve personal protective equipment, a physical exam was not personally performed.  Patient's symptoms and exam were discussed in detail with the RN.  A chart review of other providers notes and the patient's lab work as well as review of other pertinent studies was performed.  Exam details from prior documentation were reviewed specifically and confirmed with the bedside nurse.  ------------- Background on consult:  Robert Mcdaniel is a 68 y.o. male with a history of hypertension, diabetes, CAD, hyperlipidemia, and status post pacemaker placement who presented to the hospital with shortness of breath.  He was found to be COVID positive.  He was per report on 15 L of oxygen and reportedly took this off and later collapsed and was found to have PEA arrest with agonal breathing.  Patient was resuscitated.  He had 2 rounds of epi with ROSC then lost pulse again with intubation, received CPR, and regained after 2 minutes per charting.  Primary team indicates that he may have been somewhat dry on presentation and creatinine had improved from 2.3 down to 1.5 nadir.  He has recently had worsening acute renal failure with creatinine 4.62 this  morning and concurrent hyperkalemia and BUN 100.  He had been on IV fluids but was edematous so the team stopped fluids and started Lasix and albumin yesterday.  Work-up is notable for an echo with a new EF of 20-25%.  This was post arrest and was with diffuse LV hypokinesis.  Team is unsure of chronicity of these findings.  Note that he is here from [place] and so there is limited background medical information available.  His course has been complicated by encephalopathy with recent insults of the arrest as well as sedation.  Labs trended as below.  I spoke with his wife and daugther.  His family would want for him to have aggressive measures such as dialysis.  He does occasionally take motrin.  His family states that they were recently told that one of his kidneys was not working well - he had labs about a month ago in [place] and had seen a doctor for his.      Intake/Output Summary (Last 24 hours) at 06/08/2019 1208 Last data filed at 06/08/2019 1100 Gross per 24 hour  Intake 2080.76 ml  Output 4258 ml  Net -2177.24 ml    Vitals:  Vitals:   06/08/19 0800 06/08/19 0900 06/08/19 1000 06/08/19 1100  BP:      Pulse: 83 83 82 84  Resp: 14 13 14 14   Temp: (!) 97.5 F (36.4 C) (!) 97.5 F (36.4 C) (!) 97.3 F (36.3 C) (!) 97.5 F (36.4 C)  TempSrc:      SpO2: 94% 100% 99% 93%  Weight:      Height:         Physical Exam: per nursing   General adult male intubated  Eyes - does not open his eyes; left pupil is unreactive per nursing Lungs clear and diminished Heart NSR Abdomen distended and tight; bowel sounds present Extremities 2+ upper extremities and 3+ edema lower extremities  Neuro on fentanyl and propofol; not following commands or interacting GU foley in place Access nontunneled dialysis catheter right femoral   Medications reviewed   Labs:  BMP Latest Ref Rng & Units 06/08/2019 06/08/2019 06/07/2019  Glucose 70 - 99 mg/dL 289(H) - 223(H)  BUN 8 - 23 mg/dL 51(H) - 59(H)   Creatinine 0.61 - 1.24 mg/dL 2.31(H) - 2.76(H)  Sodium 135 - 145 mmol/L 134(L) 136 136  Potassium 3.5 - 5.1 mmol/L 5.1 5.0 5.9(H)  Chloride 98 - 111 mmol/L 101 - 101  CO2 22 - 32 mmol/L 25 - 27  Calcium 8.9 - 10.3 mg/dL 7.1(L) - 6.9(L)   Renal US with right kidney 10.3 and left kidney 11.2 cm; no hydro.    Assessment/Plan:   # AKI  - Secondary to ATN related to arrest.  May have underlying CKD.  CRRT started 7/31.  Renal US with right kidney 10.3 and left kidney 11.2 cm; no hydro and normal echogenicity  - Continue CRRT  - Tolerating 100 ml/hr net negative; team is ok with support of levo to remove fluid   - running at 2 liters/hr for clearance  - repeating calcium IV once now   # S/p cardiac arrest  - Supportive care per primary team   # Covid 19 infection  - Therapies per pulm and primary team   # Acute hypoxic respiratory failure  - mechanical ventilation per pulmonology   # Hyperkalemia  - 2/2 AKI  - on 2K pre and post filter fluids for now  - Follow trends   # Encephalopathy - s/p arrest - on sedation as above   # Acute systolic CHF  - UF with CRRT as tolerated   # Anemia - normocytic - no acute indication for PRBC's   Claudia Desanctis, MD 06/08/2019 12:08 PM

## 2019-06-08 NOTE — Progress Notes (Signed)
Oozing blood now noted from femoral HD catheter site. HD catheter central line dressing changed but CHG sponge saturated with blood again by end of shift. With AM heparin level lab work and oozing from mouth/HD catheter site, scheduled SQ Heparin held until further clarification with MD during AM rounds. Day shift RN notified and updated.

## 2019-06-08 NOTE — Progress Notes (Signed)
Assisted tele visit to patient with daughter.  Carolyna Yerian Ann, RN  

## 2019-06-08 NOTE — Progress Notes (Signed)
CRITICAL VALUE ALERT  Critical Value:  APTT >200  Date & Time Notied:  06/08/2019 0340  Provider Notified: Pharmacy   Orders Received/Actions taken: pharmacy will take a look into the orders and make changes as needed.   Heide Scales BSN, RN, CCRN

## 2019-06-08 NOTE — Progress Notes (Addendum)
I suspect heparin level and aPTT labs are elevated due to erratic absorption of [place] heparin 7500 unit doses along with continuous low dose regional heparin for CRRT. Hgb and plts low but stable. No current s/s of bleeding other than some light bleeding with oral care per RN. Seemed to only have a short time off CRRT yesterday.  Plan: No need to follow levels at this time so will d/c daily heparin level and aPTT Monitor s/s of bleeding  Could consider citrate for anticoagulation of CRRT machine if needed  Elenor Quinones, PharmD, BCPS, BCIDP Clinical Pharmacist 06/08/2019 5:49 AM   ADDENDUM (1330) Pt noted to have oozing blood from femoral HD cath site which is saturating sponge. Holding SQ heparin. Lab results this morning may have been accurate and pt may be fully anticoagulated on heparin via CRRT circuit alone.  Plan: D/c SQ heparin (d/w Dr. [place] Bells and Dr. Roderic Palau) Will f/u heparin level and PTT in a.m. Consider to just PTT after that  Will f/u further bleeding.  Sherlon Handing, PharmD, BCPS CGV Clinical pharmacist phone [phone] 06/08/2019 1:27 PM

## 2019-06-08 NOTE — Progress Notes (Signed)
Updated pts daughter via phone. Etta Quill.RN

## 2019-06-09 ENCOUNTER — Inpatient Hospital Stay (HOSPITAL_COMMUNITY): Payer: HRSA Program

## 2019-06-09 DIAGNOSIS — U071 COVID-19: Secondary | ICD-10-CM | POA: Diagnosis not present

## 2019-06-09 DIAGNOSIS — J9601 Acute respiratory failure with hypoxia: Secondary | ICD-10-CM | POA: Diagnosis not present

## 2019-06-09 DIAGNOSIS — N179 Acute kidney failure, unspecified: Secondary | ICD-10-CM | POA: Diagnosis not present

## 2019-06-09 LAB — POCT I-STAT 7, (LYTES, BLD GAS, ICA,H+H)
Acid-base deficit: 10 mmol/L — ABNORMAL HIGH (ref 0.0–2.0)
Acid-base deficit: 12 mmol/L — ABNORMAL HIGH (ref 0.0–2.0)
Acid-base deficit: 18 mmol/L — ABNORMAL HIGH (ref 0.0–2.0)
Bicarbonate: 20 mmol/L (ref 20.0–28.0)
Bicarbonate: 17.3 mmol/L — ABNORMAL LOW (ref 20.0–28.0)
Bicarbonate: 11.2 mmol/L — ABNORMAL LOW (ref 20.0–28.0)
Calcium, Ion: 1.08 mmol/L — ABNORMAL LOW (ref 1.15–1.40)
Calcium, Ion: 1.06 mmol/L — ABNORMAL LOW (ref 1.15–1.40)
Calcium, Ion: 1.05 mmol/L — ABNORMAL LOW (ref 1.15–1.40)
HCT: 25 % — ABNORMAL LOW (ref 39.0–52.0)
HCT: 24 % — ABNORMAL LOW (ref 39.0–52.0)
HCT: 22 % — ABNORMAL LOW (ref 39.0–52.0)
Hemoglobin: 8.5 g/dL — ABNORMAL LOW (ref 13.0–17.0)
Hemoglobin: 8.2 g/dL — ABNORMAL LOW (ref 13.0–17.0)
Hemoglobin: 7.5 g/dL — ABNORMAL LOW (ref 13.0–17.0)
O2 Saturation: 81 %
O2 Saturation: 84 %
O2 Saturation: 90 %
Patient temperature: 37
Patient temperature: 98.6
Patient temperature: 35.8
Potassium: 4.8 mmol/L (ref 3.5–5.1)
Potassium: 4.5 mmol/L (ref 3.5–5.1)
Potassium: 3.9 mmol/L (ref 3.5–5.1)
Sodium: 136 mmol/L (ref 135–145)
Sodium: 136 mmol/L (ref 135–145)
Sodium: 137 mmol/L (ref 135–145)
TCO2: 22 mmol/L (ref 22–32)
TCO2: 19 mmol/L — ABNORMAL LOW (ref 22–32)
TCO2: 13 mmol/L — ABNORMAL LOW (ref 22–32)
pCO2 arterial: 73.3 mmHg (ref 32.0–48.0)
pCO2 arterial: 55 mmHg — ABNORMAL HIGH (ref 32.0–48.0)
pCO2 arterial: 40.4 mmHg (ref 32.0–48.0)
pH, Arterial: 7.043 — CL (ref 7.350–7.450)
pH, Arterial: 7.106 — CL (ref 7.350–7.450)
pH, Arterial: 7.045 — CL (ref 7.350–7.450)
pO2, Arterial: 67 mmHg — ABNORMAL LOW (ref 83.0–108.0)
pO2, Arterial: 67 mmHg — ABNORMAL LOW (ref 83.0–108.0)
pO2, Arterial: 78 mmHg — ABNORMAL LOW (ref 83.0–108.0)

## 2019-06-09 LAB — RENAL FUNCTION PANEL
Albumin: 3 g/dL — ABNORMAL LOW (ref 3.5–5.0)
Anion gap: 17 — ABNORMAL HIGH (ref 5–15)
BUN: 37 mg/dL — ABNORMAL HIGH (ref 8–23)
CO2: 16 mmol/L — ABNORMAL LOW (ref 22–32)
Calcium: 7.3 mg/dL — ABNORMAL LOW (ref 8.9–10.3)
Chloride: 105 mmol/L (ref 98–111)
Creatinine, Ser: 1.87 mg/dL — ABNORMAL HIGH (ref 0.61–1.24)
GFR calc Af Amer: 43 mL/min — ABNORMAL LOW (ref >60)
GFR calc non Af Amer: 37 mL/min — ABNORMAL LOW (ref >60)
Glucose, Bld: 136 mg/dL — ABNORMAL HIGH (ref 70–99)
Phosphorus: 3.8 mg/dL (ref 2.5–4.6)
Potassium: 4.4 mmol/L (ref 3.5–5.1)
Sodium: 138 mmol/L (ref 135–145)

## 2019-06-09 LAB — GLUCOSE, CAPILLARY
Glucose-Capillary: 107 mg/dL — ABNORMAL HIGH (ref 70–99)
Glucose-Capillary: 153 mg/dL — ABNORMAL HIGH (ref 70–99)
Glucose-Capillary: 257 mg/dL — ABNORMAL HIGH (ref 70–99)

## 2019-06-09 LAB — CBC
HCT: 26.8 % — ABNORMAL LOW (ref 39.0–52.0)
Hemoglobin: 8 g/dL — ABNORMAL LOW (ref 13.0–17.0)
MCH: 29.5 pg (ref 26.0–34.0)
MCHC: 29.9 g/dL — ABNORMAL LOW (ref 30.0–36.0)
MCV: 98.9 fL (ref 80.0–100.0)
Platelets: 59 10*3/uL — ABNORMAL LOW (ref 150–400)
RBC: 2.71 MIL/uL — ABNORMAL LOW (ref 4.22–5.81)
RDW: 15.9 % — ABNORMAL HIGH (ref 11.5–15.5)
WBC: 8.6 10*3/uL (ref 4.0–10.5)
nRBC: 54.1 % — ABNORMAL HIGH (ref 0.0–0.2)

## 2019-06-09 LAB — MAGNESIUM: Magnesium: 3 mg/dL — ABNORMAL HIGH (ref 1.7–2.4)

## 2019-06-09 LAB — HEPARIN LEVEL (UNFRACTIONATED): Heparin Unfractionated: 2.08 IU/mL — ABNORMAL HIGH (ref 0.30–0.70)

## 2019-06-09 LAB — APTT: aPTT: >200 seconds (ref 24–36)

## 2019-06-09 MED ORDER — ACETAMINOPHEN 325 MG PO TABS: 650.0000 mg | ORAL_TABLET | Freq: Four times a day (QID) | ORAL | Status: DC | PRN

## 2019-06-09 MED ORDER — PHENYLEPHRINE HCL-NACL 10-0.9 MG/250ML-% IV SOLN
INTRAVENOUS | Status: AC
  Filled 2019-06-09: qty 250

## 2019-06-09 MED ORDER — ACETAMINOPHEN 650 MG RE SUPP: 650.0000 mg | Freq: Four times a day (QID) | RECTAL | Status: DC | PRN

## 2019-06-09 MED ORDER — FENTANYL 2500MCG IN NS 250ML (10MCG/ML) PREMIX INFUSION: 0.0000 ug/h | INTRAVENOUS | Status: DC

## 2019-06-09 MED ORDER — EPINEPHRINE 1 MG/10ML IJ SOSY
PREFILLED_SYRINGE | INTRAMUSCULAR | Status: AC
  Administered 2019-06-09: 1 mg
  Filled 2019-06-09: qty 30

## 2019-06-09 MED ORDER — SODIUM BICARBONATE 8.4 % IV SOLN: 100.0000 meq | Freq: Once | INTRAVENOUS | Status: DC

## 2019-06-09 MED ORDER — GLYCOPYRROLATE 0.2 MG/ML IJ SOLN: 0.2000 mg | INTRAMUSCULAR | Status: DC | PRN

## 2019-06-09 MED ORDER — GLYCOPYRROLATE 1 MG PO TABS: 1.0000 mg | ORAL_TABLET | ORAL | Status: DC | PRN

## 2019-06-09 MED ORDER — DEXTROSE 5 % IV SOLN: INTRAVENOUS | Status: DC

## 2019-06-09 MED ORDER — SODIUM CHLORIDE 0.9 % IV SOLN
INTRAVENOUS | Status: DC | PRN
  Administered 2019-06-09: 250 mL via INTRAVENOUS

## 2019-06-09 MED ORDER — SODIUM BICARBONATE 8.4 % IV SOLN
INTRAVENOUS | Status: AC
  Filled 2019-06-09: qty 100

## 2019-06-09 MED ORDER — EPINEPHRINE PF 1 MG/ML IJ SOLN
0.5000 ug/min | INTRAVENOUS | Status: DC
  Administered 2019-06-09: 20 ug/min via INTRAVENOUS
  Filled 2019-06-09 (×2): qty 4

## 2019-06-09 MED ORDER — HALOPERIDOL 0.5 MG PO TABS
0.5000 mg | ORAL_TABLET | ORAL | Status: DC | PRN
  Filled 2019-06-09: qty 1

## 2019-06-09 MED ORDER — HALOPERIDOL LACTATE 2 MG/ML PO CONC
0.5000 mg | ORAL | Status: DC | PRN
  Filled 2019-06-09: qty 0.3

## 2019-06-09 MED ORDER — HALOPERIDOL LACTATE 5 MG/ML IJ SOLN: 0.5000 mg | INTRAMUSCULAR | Status: DC | PRN

## 2019-06-09 MED ORDER — SODIUM CHLORIDE 0.9 % IV SOLN
2.0000 g | Freq: Two times a day (BID) | INTRAVENOUS | Status: DC
  Administered 2019-06-09: 06:00:00 2 g via INTRAVENOUS
  Filled 2019-06-09: qty 2

## 2019-06-09 MED ORDER — ONDANSETRON HCL 4 MG/2ML IJ SOLN: 4.0000 mg | Freq: Four times a day (QID) | INTRAMUSCULAR | Status: DC | PRN

## 2019-06-09 MED ORDER — VASOPRESSIN 20 UNIT/ML IV SOLN
0.0300 [IU]/min | INTRAVENOUS | Status: DC
  Administered 2019-06-09: 0.03 [IU]/min via INTRAVENOUS
  Filled 2019-06-09: qty 2

## 2019-06-09 MED ORDER — SODIUM BICARBONATE 8.4 % IV SOLN
INTRAVENOUS | Status: DC
  Administered 2019-06-09: 05:00:00 via INTRAVENOUS
  Filled 2019-06-09: qty 150

## 2019-06-09 MED ORDER — ONDANSETRON 4 MG PO TBDP: 4.0000 mg | ORAL_TABLET | Freq: Four times a day (QID) | ORAL | Status: DC | PRN

## 2019-06-09 MED ORDER — SODIUM BICARBONATE 8.4 % IV SOLN
INTRAVENOUS | Status: AC
  Filled 2019-06-09: qty 50

## 2019-06-09 MED ORDER — POLYVINYL ALCOHOL 1.4 % OP SOLN: 1.0000 [drp] | Freq: Four times a day (QID) | OPHTHALMIC | Status: DC | PRN

## 2019-06-09 MED ORDER — DIPHENHYDRAMINE HCL 50 MG/ML IJ SOLN: 25.0000 mg | INTRAMUSCULAR | Status: DC | PRN

## 2019-06-09 MED ORDER — BIOTENE DRY MOUTH MT LIQD: 15.0000 mL | OROMUCOSAL | Status: DC | PRN

## 2019-06-09 MED ORDER — POLYVINYL ALCOHOL 1.4 % OP SOLN
1.0000 [drp] | Freq: Four times a day (QID) | OPHTHALMIC | Status: DC | PRN
  Filled 2019-06-09: qty 15

## 2019-06-09 MED ORDER — SODIUM BICARBONATE 8.4 % IV SOLN
100.0000 meq | Freq: Once | INTRAVENOUS | Status: AC
  Administered 2019-06-09: 100 meq via INTRAVENOUS

## 2019-06-09 MED ORDER — SODIUM BICARBONATE 8.4 % IV SOLN
INTRAVENOUS | Status: AC
  Administered 2019-06-09: 100 meq via INTRAVENOUS
  Filled 2019-06-09: qty 50

## 2019-06-10 LAB — BLOOD CULTURE ID PANEL (REFLEXED)
Acinetobacter baumannii: NOT DETECTED
Candida albicans: NOT DETECTED
Candida glabrata: NOT DETECTED
Candida krusei: NOT DETECTED
Candida parapsilosis: NOT DETECTED
Candida tropicalis: NOT DETECTED
Carbapenem resistance: NOT DETECTED
Enterobacter cloacae complex: NOT DETECTED
Enterobacteriaceae species: NOT DETECTED
Enterococcus species: NOT DETECTED
Escherichia coli: NOT DETECTED
Haemophilus influenzae: NOT DETECTED
Klebsiella oxytoca: NOT DETECTED
Klebsiella pneumoniae: NOT DETECTED
Listeria monocytogenes: NOT DETECTED
Neisseria meningitidis: NOT DETECTED
Proteus species: NOT DETECTED
Serratia marcescens: NOT DETECTED
Staphylococcus aureus (BCID): NOT DETECTED
Staphylococcus species: NOT DETECTED
Streptococcus agalactiae: NOT DETECTED
Streptococcus pneumoniae: NOT DETECTED
Streptococcus pyogenes: NOT DETECTED
Streptococcus species: NOT DETECTED

## 2019-06-12 LAB — CULTURE, BLOOD (ROUTINE X 2)
Special Requests: ADEQUATE
Special Requests: ADEQUATE

## 2019-07-08 NOTE — Progress Notes (Signed)
Patients daughter called and updated from events during the night shift. Daughter made aware of patients low BP and need for higher pressor support. Daughter made aware of sedation being stopped due to hypotension and patient remaining unresponsive. Daughter made aware of Bicarb infusion being initiated. Daughter informed that CRRT dialysis continued but nursing staff was unable to continue to pull fluid off to assist with decreasing volume overload/edema. Opportunity for questions provided and answered. Daughter asks about further 'brain testing.' Daughter advised that per nursing staff, patient too unstable for MRI to be obtained at this time. Daughter advised to follow up with MD during AM rounds.

## 2019-07-08 NOTE — Progress Notes (Signed)
NAME:  Robert Mcdaniel, MRN:  [id number], DOB:  [birth year], LOS: 7 ADMISSION DATE:  06/05/2019, CONSULTATION DATE: July 27 REFERRING MD: [place] Pink, CHIEF COMPLAINT: Dyspnea  Brief History   68 year old male admitted on July 20 with ARDS from COVID-19 pneumonia.  Had a cardiac arrest on July 29 in setting of hypoxemia.  Past Medical History  Hypertension Hyperlipidemia DM2 CAD  Significant Hospital Events   7/27 admission to [facility] 7/28 cardiac arrest, off o2, PEA, 6 mins  7/30 off sedation, no response to painful stimuli 7/31 started CVVHD 8/1 Severe ventilator dyssynchrony overnight, needed more sedation overnight, not making purposeful movements  Consults:  Pulmonary critical care  Procedures:  7/28 CPR - 2 rounds EPI, 1 amp bicarb, ROSC  7/28 Intubation  7/28 CVC RIJ 7/31 HD cath R fem >   Significant Diagnostic Tests:  7/28 Echo> LVEF 20 to 25% with global hypokinesis, low normal RV systolic function, pacer wires in 7/30 CT head - NAICP 7/31 renal ultrasound> no hydro  Micro Data:  7/27 SARS-COV-2 > positive  Antimicrobials:  7/27 remdesivir>  7/27 decadron>  7/27 tocilizumab   Interim history/subjective:   Worsening shock overnight Very dyssychronous  Objective   Blood pressure (!) 149/65, pulse 86, temperature (!) 95.5 F (35.3 C), resp. rate (!) 34, height 5' (1.524 m), weight 80.2 kg, SpO2 100 %.    Vent Mode: PCV FiO2 (%):  [60 %-80 %] 60 % Set Rate:  [35 bmp] 35 bmp Vt Set:  [400 mL] 400 mL PEEP:  [16 cmH20] 16 cmH20 Plateau Pressure:  [34 cmH20-41 cmH20] 39 cmH20   Intake/Output Summary (Last 24 hours) at 06-24-19 1035 Last data filed at 2019/06/24 0900 Gross per 24 hour  Intake 2816.78 ml  Output 3432 ml  Net -615.22 ml   Filed Weights   06/06/19 0500 06/07/19 0500 06/08/19 0330  Weight: 82.9 kg 82.7 kg 80.2 kg    Examination:  General:  In bed on vent HENT: NCAT ETT in place PULM: CTA B, vent supported breathing CV: RRR, no mgr GI: BS+,  soft, nontender MSK: normal bulk and tone Neuro: sedated on vent   July 31 chest x-ray images independently reviewed showing severe bilateral airspace disease, cardiomegaly, 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 > stop  CVVHD Withdrawal of care  Acute encephalopathy:  Fentanyl for comfort  Cardiogenic shock: severe, worsening overnight Systolic heart faiulre Start epinephrine infusion titrated to maintain BP while family visits  Goals of care: plan withdrawal of care today  Best practice:  Diet: tube feeding Pain/Anxiety/Delirium protocol (if indicated):n/a, withdrawal of care VAP protocol (if indicated): yes DVT prophylaxis: sub q hep GI prophylaxis: famotidine Glucose control: SSI Mobility: bed rest Code Status: DNR Family Communication: will discuss with TR[facility] Disposition: remain in ICU  Labs   CBC: Recent Labs  Lab 06/04/19 0530  06/05/19 0325  06/06/19 0450  06/07/19 0540  06/08/19 0411 Jun 24, 2019 0308 2019/06/24 0408 06-24-2019 0445 06-24-19 0716  WBC 13.2*  --  13.2*  --  12.3*  --  12.3*  --  19.0*  --   --  8.6  --   NEUTROABS 11.7*  --  12.0*  --  10.9*  --  11.0*  --  17.1*  --   --   --   --   HGB 10.6*   < > 11.1*   < > 9.0*   < > 8.2*   < > 8.0* 8.5* 8.2* 8.0* 7.5*  HCT 33.1*   < > 34.3*   < > 28.9*   < > 26.2*   < > 25.6* 25.0* 24.0* 26.8* 22.0*  MCV 92.5  --  92.7  --  96.0  --  96.3  --  96.6  --   --  98.9  --   PLT 175  --  156  --  154  --  113*  --  118*  --   --  59*  --    < > = values in this interval not displayed.    Basic Metabolic Panel: Recent Labs  Lab 06/05/19 0325  06/05/19 1717  06/07/19 0540 06/07/19 1610  06/08/19 0411 06/08/19 1610 07-06-2019 0308 06-Jul-2019 0408 06-Jul-2019 0445 07-06-2019 0716  NA 139 135   < > 137  137 136   < > 134* 137 136 136 138 137  K 3.8 5.5*   < > 5.2*  5.2* 5.9*   < > 5.1 5.0 4.8 4.5 4.4 3.9  CL 98 94*   < > 101  100 101  --  101 102  --   --  105  --   CO2 28 25   < > 27  26 27   --  25 22  --   --  16*  --   GLUCOSE 271* 505*   < > 190*  189* 223*  --  289* 260*  --   --  136*  --   BUN 77* 88*   < > 78*  77* 59*  --  51* 45*  --   --  37*  --   CREATININE 3.28* 3.93*   < > 3.37*  3.35* 2.76*  --  2.31* 1.90*  --   --  1.87*  --   CALCIUM 6.5* 6.1*   < > 6.7*  6.7* 6.9*  --  7.1* 7.3*  --   --  7.3*  --   MG 2.2 2.3  --  2.4  --   --  2.7*  --   --   --  3.0*  --   PHOS 6.4* 7.6*   < > 4.6 4.4  --  3.6 3.7  --   --  3.8  --    < > = values in this interval not displayed.   GFR: Estimated Creatinine Clearance: 34.6 mL/min (A) (by C-G formula based on SCr of 1.87 mg/dL (H)). Recent Labs  Lab 06/03/19 0550 06/03/19 1547 06/03/19 2045 06/04/19 0530  06/06/19 0450 06/07/19 0540 06/08/19 0411 07-06-2019 0445  PROCALCITON 0.40  --   --  1.93  --   --   --   --   --   WBC 6.3 7.2  --  13.2*   < > 12.3* 12.3* 19.0* 8.6  LATICACIDVEN  --  >11.0* 1.6  --   --   --   --   --   --    < > = values in this interval not displayed.    Liver Function Tests: Recent Labs  Lab 06/03/19 1547 06/04/19 0530 06/05/19 0325  06/06/19 0450  06/07/19 0540 06/07/19 1610 06/08/19 0411 06/08/19 1610 July 06, 2019 0445  AST 249* 501* 235*  --  85*  --  95*  --   --   --   --   ALT 176* 198* 158*  --  93*  --  83*  --   --   --   --   ALKPHOS 145* 181* 195*  --  164*  --  169*  --   --   --   --   BILITOT 0.7 0.6 0.5  --  0.7  --  0.6  --   --   --   --   PROT 5.5* 5.2* 5.2*  --  5.8*  --  5.4*  --   --   --   --   ALBUMIN 2.3* 2.2* 2.3*   < > 3.6   < > 3.3*  3.3* 3.3* 3.3* 3.2* 3.0*   < > = values in this interval not  displayed.   No results for input(s): LIPASE, AMYLASE in the last 168 hours. No results for input(s): AMMONIA in the last 168 hours.  ABG    Component Value Date/Time   PHART 7.045 (LL) June 20, 2019 0716   PCO2ART 40.4 06-20-19 0716   PO2ART 78.0 (L) Jun 20, 2019 0716   HCO3 11.2 (L) 2019-06-20 0716   TCO2 13 (L) 06-20-19 0716   ACIDBASEDEF 18.0 (H) 06/20/19 0716   O2SAT 90.0 Jun 20, 2019 0716     Coagulation Profile: Recent Labs  Lab 06/03/19 1547  INR 1.2    Cardiac Enzymes: No results for input(s): CKTOTAL, CKMB, CKMBINDEX, TROPONINI in the last 168 hours.  HbA1C: Hgb A1c MFr Bld  Date/Time Value Ref Range Status  06/03/2019 05:50 AM 13.8 (H) 4.8 - 5.6 % Final    Comment:    (NOTE) Pre diabetes:          5.7%-6.4% Diabetes:              >6.4% Glycemic control for   <7.0% adults with diabetes     CBG: Recent Labs  Lab 06/08/19 1601 06/08/19 1927 20-Jun-2019 0025 06-20-19 0356 2019/06/20 0748  GLUCAP 252* 230* 257* 153* 107*       Critical care time: 30 minutes coordinating management of shock, respiratory failure and managing end of life care.      Roselie Awkward, MD [facility] Pager: [phone] Cell: [phone] If no response, call [phone]

## 2019-07-08 NOTE — Progress Notes (Signed)
CRITICAL VALUE ALERT  Critical Value:  Arterial Blood Gas pH 7.045 / HCO3 11.2  Date & Time Notied:  07-01-19 0800  Provider Notified: Dr. Kathie Dike, MD.  Orders Received/Actions taken: Administer 2 amps of bicarb.

## 2019-07-08 NOTE — Progress Notes (Signed)
Pt transported to [facility] Visit room for brief visit with family prior to comfort extubation. Pt stable throughout with no complications. VS within normal limits.

## 2019-07-08 NOTE — Progress Notes (Signed)
[facility] Progress Note Patient Name: Robert Mcdaniel DOB: 1951-08-06 MRN: [id number]   Date of Service  06-14-2019  HPI/Events of Note  ABG 7.04/73/67 on PRVC [facility] 10-13 Switched to [facility] MV 19  eICU Interventions  Repeat ABG after 30 minutes     Intervention Category Major Interventions: Respiratory failure - evaluation and management;Acid-Base disturbance - evaluation and management  Judd Lien June 14, 2019, 3:32 AM

## 2019-07-08 NOTE — Progress Notes (Signed)
Wasted 20cc of fentanyl with Athena Masse, RN.

## 2019-07-08 NOTE — Progress Notes (Signed)
Patient's belongings including prescriptions for atorvastatin, metoprolol, coricidin HBP, colon clenz, aspirin, lantus, phone charger, iPhone, and clothing - shorts, socks, and shirt packaged for family to pick up.

## 2019-07-08 NOTE — Procedures (Signed)
Extubation Procedure Note  Patient Details:   Name: Robert Mcdaniel DOB: 10-02-1951 MRN: [id number]   Airway Documentation:    Vent end date: 2019-06-23 Vent end time: 1030   Evaluation  Pt extubated to RA per Withdrawal of Life Protocol.    Jesse Sans 2019/06/23, 1:38 PM

## 2019-07-08 NOTE — Progress Notes (Signed)
Changes to vent settings based upon ABG results.  Pt changed to PCV of 28, rate of 35, FiO2 at 60% and 16+ peep.  RT will obtain another ABG within 30 mins.

## 2019-07-08 NOTE — Discharge Summary (Signed)
Death Summary  Robert Mcdaniel WJX:[id number] DOB: [birth year] DOA: Jun 28, 2019  PCP: Patient, No Pcp Per  Admit date: 2019/06/28 Date of Death: 07/05/2019 Time of Death: 11:19AM  History of present illness:  Robert Mcdaniel is a 68 y.o. male with a history of hypertension, hyperlipidemia, diabetes presents to the hospital with complaints of shortness of breath and a dry cough.  The patient did have nausea and vomiting for a few days prior to admission.  He was found to be positive for COVID-19.  Chest x-ray did show pneumonia.  The patient was hospitalized for further management.  He was initially admitted to [facility] where he was placed on heated high flow oxygen.  After he was admitted and being monitored, at 1 point patient removed his oxygen and began to get out of bed.  He quickly became hypoxic and collapsed onto the floor.  He was noted to be in PEA and underwent CPR for cardiac arrest.  Resuscitation efforts lasted approximately 5 to 6 minutes.  ROSC was achieved.  Patient remained intubated and subsequently developed worsening respiratory failure.  He was treated for COVID-19 with steroids, Remdesivir, Actemra.  Unfortunately, his overall respiratory status did not improve.  Echocardiogram was obtained that showed ejection fraction of 20% with diffuse hypokinesis.  He also developed worsening renal failure and was eventually placed on CRRT.  He was followed by pulmonology, nephrology and palliative care.  He was also seen by neurology.  The patient did not have any significant improvement in mental status since his cardiac arrest.  CT head was found to be unrevealing.  MRI could not be performed due to presence of pacemaker.  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  Despite aggressive measures, his overall condition continued to deteriorate.  After discussing with family, he was made DNR.  Family came to see the patient, after which compassionate extubation was performed.   Shortly after extubation, he was found without pulse or respirations.  He was pronounced dead at that time.  Family was provided support.   Final Diagnoses:  Principal Problem:   COVID-19 virus infection Active Problems:   CAP (community acquired pneumonia)   Essential hypertension   Hyperlipidemia   Diabetes ([place])   Acute renal failure (ARF) (HCC)   Protein-calorie malnutrition, severe (HCC)   COVID-19   Acute respiratory failure with hypoxia (HCC)   AKI (acute kidney injury) ([place])   Goals of care, counseling/discussion   Palliative care by specialist   Thrombocytopenia ([place])   Anemia of acute infection     The results of significant diagnostics from this hospitalization (including imaging, microbiology, ancillary and laboratory) are listed below for reference.    Significant Diagnostic Studies: Ct Head Wo Contrast  Result Date: 06/05/2019 CLINICAL DATA:  Cardiac arrest EXAM: CT HEAD WITHOUT CONTRAST TECHNIQUE: Contiguous axial images were obtained from the base of the skull through the vertex without intravenous contrast. COMPARISON:  None. FINDINGS: Brain: No evidence of acute infarction, hemorrhage, hydrocephalus, extra-axial collection or mass lesion/mass effect. Vascular: No hyperdense vessel or unexpected calcification. Skull: Normal. Negative for fracture or focal lesion. Sinuses/Orbits: No acute finding. Other: None. IMPRESSION: No acute intracranial pathology. Electronically Signed   By: Eddie Candle M.D.   On: 06/05/2019 13:45   US Renal  Result Date: 06/07/2019 CLINICAL DATA:  Acute renal failure. EXAM: RENAL / URINARY TRACT ULTRASOUND COMPLETE COMPARISON:  None. FINDINGS: Right Kidney: Renal measurements: 10.3 x 5.7 x 6.5 cm = volume: 200.7 mL . Echogenicity  within normal limits. No mass or hydronephrosis visualized. Mild perinephric fluid. Left Kidney: Renal measurements: 11.2 x 6.0 x 6.0 cm = volume: 210.5 mL. Echogenicity within normal limits. No mass or hydronephrosis  visualized. Mild perinephric fluid. Bladder: Decompressed with Foley catheter. IMPRESSION: Small amount of perinephric fluid bilaterally. No hydronephrosis. Electronically Signed   By: Lovey Newcomer M.D.   On: 06/07/2019 08:34   Dg Chest Port 1 View  Result Date: 06/14/19 CLINICAL DATA:  Respiratory distress. EXAM: PORTABLE CHEST 1 VIEW COMPARISON:  06/06/2019. FINDINGS: Endotracheal tube, NG tube, right IJ line stable position. Prior median sternotomy. Cardiac pacer stable position. Heart size stable. Diffuse bilateral pulmonary infiltrates/edema again noted. Interim improvement from prior exam. No pleural effusion or pneumothorax. Right costophrenic angle incompletely imaged. Density noted in the stomach may represent contrast. No acute bony abnormality. IMPRESSION: 1.  Lines and tubes stable position. 2. Diffuse bilateral pulmonary infiltrates/edema again noted. Interim improvement from prior exam. Electronically Signed   By: Marcello Moores  Register   On: 2019/06/14 05:47   Dg Chest Port 1 View  Result Date: 06/06/2019 CLINICAL DATA:  ARDS and hypoxia EXAM: PORTABLE CHEST 1 VIEW COMPARISON:  June 04, 2019 FINDINGS: There is cardiomegaly. Endotracheal tube is seen with the tip 1.7 cm above the level of the carina. The NG tube is coiled within the stomach. A left-sided pacemaker seen with the lead tips in the right atrium and right ventricle. Again noted are patchy ground-glass opacities throughout both lungs. This is not significantly changed since the prior. IMPRESSION: 1. No significant change in the bilateral airspace opacities. 2. Cardiomegaly 3. Lines and tubes in appropriate position. Electronically Signed   By: Prudencio Pair M.D.   On: 06/06/2019 23:37   Dg Chest Port 1 View  Result Date: 06/04/2019 CLINICAL DATA:  ARDS. EXAM: PORTABLE CHEST 1 VIEW COMPARISON:  06/03/2019 FINDINGS: Endotracheal tube is 3.6 cm above the carina. Right jugular central line in the upper SVC region. Again noted is a dual  chamber cardiac pacemaker. Nasogastric tube in the left upper abdomen with the tip near the gastric fundus region. Again noted are bilateral parenchymal lung densities. Slightly improved aeration in both lungs compared to the recent comparison examination. Negative for a pneumothorax. IMPRESSION: 1. Improved aeration in both lungs with persistent bilateral parenchymal lung disease. 2. Support apparatuses appear to be appropriately positioned. Electronically Signed   By: Markus Daft M.D.   On: 06/04/2019 11:41   Dg Chest Port 1 View  Result Date: 06/03/2019 CLINICAL DATA:  Intubated EXAM: PORTABLE CHEST 1 VIEW COMPARISON:  06/04/2019 FINDINGS: Diffuse bilateral interstitial and patchy alveolar airspace opacities which are worsening. No pleural effusion or pneumothorax. Stable cardiomediastinal silhouette. Endotracheal tube with the tip extending into the left mainstem bronchus. Recommend retracting the endotracheal tube approximately 4 cm. Nasogastric tube coursing below the diaphragm. Right jugular central venous catheter with the tip projecting over the SVC. Dual lead cardiac pacemaker again noted. IMPRESSION: 1. Worsening diffuse bilateral interstitial and patchy alveolar airspace opacities which may reflect worsening pulmonary edema versus multilobar pneumonia. 2. Endotracheal tube with the tip extending into the left mainstem bronchus. Recommend retracting the endotracheal tube approximately 4 cm. Electronically Signed   By: Kathreen Devoid   On: 06/03/2019 16:26   Dg Chest Portable 1 View  Result Date: 05/18/2019 CLINICAL DATA:  Upper back pain. COVID-19 positive with test 1 week ago. EXAM: PORTABLE CHEST 1 VIEW COMPARISON:  None. FINDINGS: Heart size is exaggerated by low lung volumes. Diffuse interstitial  and airspace disease is present. Disease is worse on the left upper lobe and right lower lobe. 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 The patient is status post median sternotomy. IMPRESSION: 1. Low lung  volumes with diffuse interstitial and airspace disease bilaterally concerning for pneumonia. Electronically Signed   By: San Morelle M.D.   On: 05/11/2019 02:41    Microbiology: Recent Results (from the past 240 hour(s))  Culture, blood (Routine x 2)     Status: None   Collection Time: 05/18/2019 12:55 AM   Specimen: BLOOD  Result Value Ref Range Status   Specimen Description   Final    BLOOD LEFT ANTECUBITAL Performed at [facility] [address], [place], [place] [address]    Special Requests   Final    BOTTLES DRAWN AEROBIC AND ANAEROBIC Blood Culture results may not be optimal due to an excessive volume of blood received in culture bottles Performed at [facility] [address], [place], [place] [address]    Culture   Final    NO GROWTH 5 DAYS Performed at [facility], [place] [address] [place] [address] [place], [place] [address]    Report Status 06/07/2019 FINAL  Final  Culture, blood (Routine x 2)     Status: None   Collection Time: 05/22/2019 12:59 AM   Specimen: BLOOD RIGHT FOREARM  Result Value Ref Range Status   Specimen Description   Final    BLOOD RIGHT FOREARM Performed at [facility], [place] [address], [place], [place] [address]    Special Requests   Final    BOTTLES DRAWN AEROBIC AND ANAEROBIC Blood Culture adequate volume Performed at [facility] [address] [place], [place] [address]    Culture   Final    NO GROWTH 5 DAYS Performed at [facility], [place] [address] [place], [place] [address]    Report Status 06/07/2019 FINAL  Final  SARS Coronavirus 2 (CEPHEID - Performed in [facility] hospital lab), Hosp Order     Status: Abnormal   Collection Time: 05/13/2019  3:52 AM   Specimen: Nasopharyngeal Swab  Result Value Ref Range Status   SARS Coronavirus 2 POSITIVE (A) NEGATIVE Final    Comment: RESULT CALLED TO, READ BACK BY AND VERIFIED WITH: S.BINGHAM RN AT 0277 ON 05/21/2019 BY  S.VANHOORNE (NOTE) If result is NEGATIVE SARS-CoV-2 target nucleic acids are NOT DETECTED. The SARS-CoV-2 RNA is generally detectable in upper and lower  respiratory specimens during the acute phase of infection. The lowest  concentration of SARS-CoV-2 viral copies this assay can detect is 250  copies / mL. A negative result does not preclude SARS-CoV-2 infection  and should not be used as the sole basis for treatment or other  patient management decisions.  A negative result may occur with  improper specimen collection / handling, submission of specimen other  than nasopharyngeal swab, presence of viral mutation(s) within the  areas targeted by this assay, and inadequate number of viral copies  (<250 copies / mL). A negative result must be combined with clinical  observations, patient history, and epidemiological information. If result is POSITIVE SARS-CoV-2 target nucleic acids are  DETECTED. The SARS-CoV-2 RNA is generally detectable in upper and lower  respiratory specimens during the acute phase of infection.  Positive  results are indicative of active infection with SARS-CoV-2.  Clinical  correlation with patient history and other diagnostic information is  necessary to determine patient infection status.  Positive results do  not rule out bacterial infection or co-infection with other viruses. If result is PRESUMPTIVE POSTIVE SARS-CoV-2 nucleic acids MAY BE PRESENT.   A presumptive positive result was obtained on the submitted specimen  and confirmed on repeat testing.  While 2019 novel coronavirus  (SARS-CoV-2) nucleic acids may be present in the submitted sample  additional confirmatory testing may be necessary for epidemiological  and / or clinical management purposes  to differentiate between  SARS-CoV-2 and other Sarbecovirus currently known to infect humans.  If clinically indicated additional testing with an alternate test  methodology  [phone]) is advised. The  SARS-CoV-2 RNA is generally  detectable in upper and lower respiratory specimens during the acute  phase of infection. The expected result is Negative. Fact Sheet for Patients:  StrictlyIdeas.no Fact Sheet for Healthcare Providers: BankingDealers.co.za This test is not yet approved or cleared by the [place] FDA and has been authorized for detection and/or diagnosis of SARS-CoV-2 by FDA under an Emergency Use Authorization (EUA).  This EUA will remain in effect (meaning this test can be used) for the duration of the COVID-19 declaration under Section 564(b)(1) of the Act, 21 U.S.C. section 360bbb-3(b)(1), unless the authorization is terminated or revoked sooner. Performed at [facility], [place] [address] [place], [place] [address]   MRSA PCR Screening     Status: None   Collection Time: 05/31/2019  4:54 PM   Specimen: Nasal Mucosa; Nasopharyngeal  Result Value Ref Range Status   MRSA by PCR NEGATIVE NEGATIVE Final    Comment:        The GeneXpert MRSA Assay (FDA approved for NASAL specimens only), is one component of a comprehensive MRSA colonization surveillance program. It is not intended to diagnose MRSA infection nor to guide or monitor treatment for MRSA infections. Performed at [facility], [place] [address] [place], [place] [address]      Labs: Basic Metabolic Panel: Recent Labs  Lab 06/05/19 0325 06/05/19 1717  06/07/19 0540 06/07/19 1610  06/08/19 0411 06/08/19 1610 June 28, 2019 0308 06/28/19 0408 06/28/2019 0445 06/28/19 0716  NA 139 135   < > 137   137 136   < > 134* 137 136 136 138 137  K 3.8 5.5*   < > 5.2*   5.2* 5.9*   < > 5.1 5.0 4.8 4.5 4.4 3.9  CL 98 94*   < > 101   100 101  --  101 102  --   --  105  --   CO2 28 25   < > 27   26 27   --  25 22  --   --  16*  --   GLUCOSE 271* 505*   < > 190*   189* 223*  --  289* 260*  --   --  136*  --   BUN 77* 88*   < > 78*   77*  59*  --  51* 45*  --   --  37*  --   CREATININE 3.28* 3.93*   < > 3.37*   3.35* 2.76*  --  2.31* 1.90*  --   --  1.87*  --   CALCIUM 6.5* 6.1*   < > 6.7*   6.7* 6.9*  --  7.1* 7.3*  --   --  7.3*  --   MG 2.2 2.3  --  2.4  --   --  2.7*  --   --   --  3.0*  --  PHOS 6.4* 7.6*   < > 4.6 4.4  --  3.6 3.7  --   --  3.8  --    < > = values in this interval not displayed.   Liver Function Tests: Recent Labs  Lab 06/03/19 1547 06/04/19 0530 06/05/19 0325  06/06/19 0450  06/07/19 0540 06/07/19 1610 06/08/19 0411 06/08/19 1610 15-Jun-2019 0445  AST 249* 501* 235*  --  85*  --  95*  --   --   --   --   ALT 176* 198* 158*  --  93*  --  83*  --   --   --   --   ALKPHOS 145* 181* 195*  --  164*  --  169*  --   --   --   --   BILITOT 0.7 0.6 0.5  --  0.7  --  0.6  --   --   --   --   PROT 5.5* 5.2* 5.2*  --  5.8*  --  5.4*  --   --   --   --   ALBUMIN 2.3* 2.2* 2.3*   < > 3.6   < > 3.3*   3.3* 3.3* 3.3* 3.2* 3.0*   < > = values in this interval not displayed.   No results for input(s): LIPASE, AMYLASE in the last 168 hours. No results for input(s): AMMONIA in the last 168 hours. CBC: Recent Labs  Lab 06/04/19 0530  06/05/19 0325  06/06/19 0450  06/07/19 0540  06/08/19 0411 06/15/19 0308 06-15-19 0408 2019-06-15 0445 15-Jun-2019 0716  WBC 13.2*  --  13.2*  --  12.3*  --  12.3*  --  19.0*  --   --  8.6  --   NEUTROABS 11.7*  --  12.0*  --  10.9*  --  11.0*  --  17.1*  --   --   --   --   HGB 10.6*   < > 11.1*   < > 9.0*   < > 8.2*   < > 8.0* 8.5* 8.2* 8.0* 7.5*  HCT 33.1*   < > 34.3*   < > 28.9*   < > 26.2*   < > 25.6* 25.0* 24.0* 26.8* 22.0*  MCV 92.5  --  92.7  --  96.0  --  96.3  --  96.6  --   --  98.9  --   PLT 175  --  156  --  154  --  113*  --  118*  --   --  59*  --    < > = values in this interval not displayed.   Cardiac Enzymes: No results for input(s): CKTOTAL, CKMB, CKMBINDEX, TROPONINI in the last 168 hours. D-Dimer Recent Labs    06/07/19 0540  DDIMER 5.45*    BNP: Invalid input(s): POCBNP CBG: Recent Labs  Lab 06/08/19 1601 06/08/19 1927 06-15-19 0025 Jun 15, 2019 0356 15-Jun-2019 0748  GLUCAP 252* 230* 257* 153* 107*   Anemia work up Recent Labs    06/07/19 0540  FERRITIN 2,845*   Urinalysis    Component Value Date/Time   COLORURINE YELLOW 05/30/2019 0059   APPEARANCEUR CLOUDY (A) 05/24/2019 0059   LABSPEC 1.017 05/24/2019 0059   PHURINE 5.0 05/21/2019 0059   GLUCOSEU 50 (A) 05/27/2019 0059   HGBUR SMALL (A) 05/25/2019 0059   BILIRUBINUR NEGATIVE 05/31/2019 0059   KETONESUR 5 (A) 05/14/2019 0059   PROTEINUR 100 (A) 05/22/2019 0059   NITRITE NEGATIVE 05/29/2019  [place] 05/30/2019 0059   Sepsis Labs Invalid input(s): PROCALCITONIN,  WBC,  LACTICIDVEN     SIGNED:  Kathie Dike, MD  [facility] Jul 03, 2019, 2:23 PM Pager   If 7PM-7AM, please contact night-coverage [email or web address] Password [email or web address]

## 2019-07-08 NOTE — Progress Notes (Signed)
Robert Mcdaniel

## 2019-07-08 NOTE — Progress Notes (Signed)
[facility] Progress Note Patient Name: Robert Mcdaniel DOB: 1951-04-16 MRN: [id number]   Date of Service  06/20/19  HPI/Events of Note  Increasing pressor requirement, added vasopressin. Reportedly hypothermic for the past 2 days. WBC 19 yesterday. No previous antibiotics. Received Remdesevir and Tocilizumab  eICU Interventions   Start bicarb drip  Pan culture and CXR ordered  Start Cefepime     Intervention Category Major Interventions: Sepsis - evaluation and management  Shona Needles Gweneth Fredlund 06-20-2019, 5:03 AM

## 2019-07-08 NOTE — Progress Notes (Signed)
CRITICAL VALUE ALERT  Critical Value: aPTT greater than 200.    Date & Time Notied:  14-Jun-2019 0759  Provider Notified: Dr. Kathie Dike, MD  Orders Received/Actions taken: Patient no longer on SQ Heparin. Discuss with pharmacy/nephrology discontinuing Heparin via CRRT circuit. MD to further assess patient during AM rounds.

## 2019-07-08 DEATH — deceased

## 2021-02-22 IMAGING — DX PORTABLE CHEST - 1 VIEW
1 series · 1 of 1 positions shown · non-contrast
Comparison: June 04, 2019

CLINICAL DATA: ARDS and hypoxia

EXAM:
PORTABLE CHEST 1 VIEW

[chest ap]
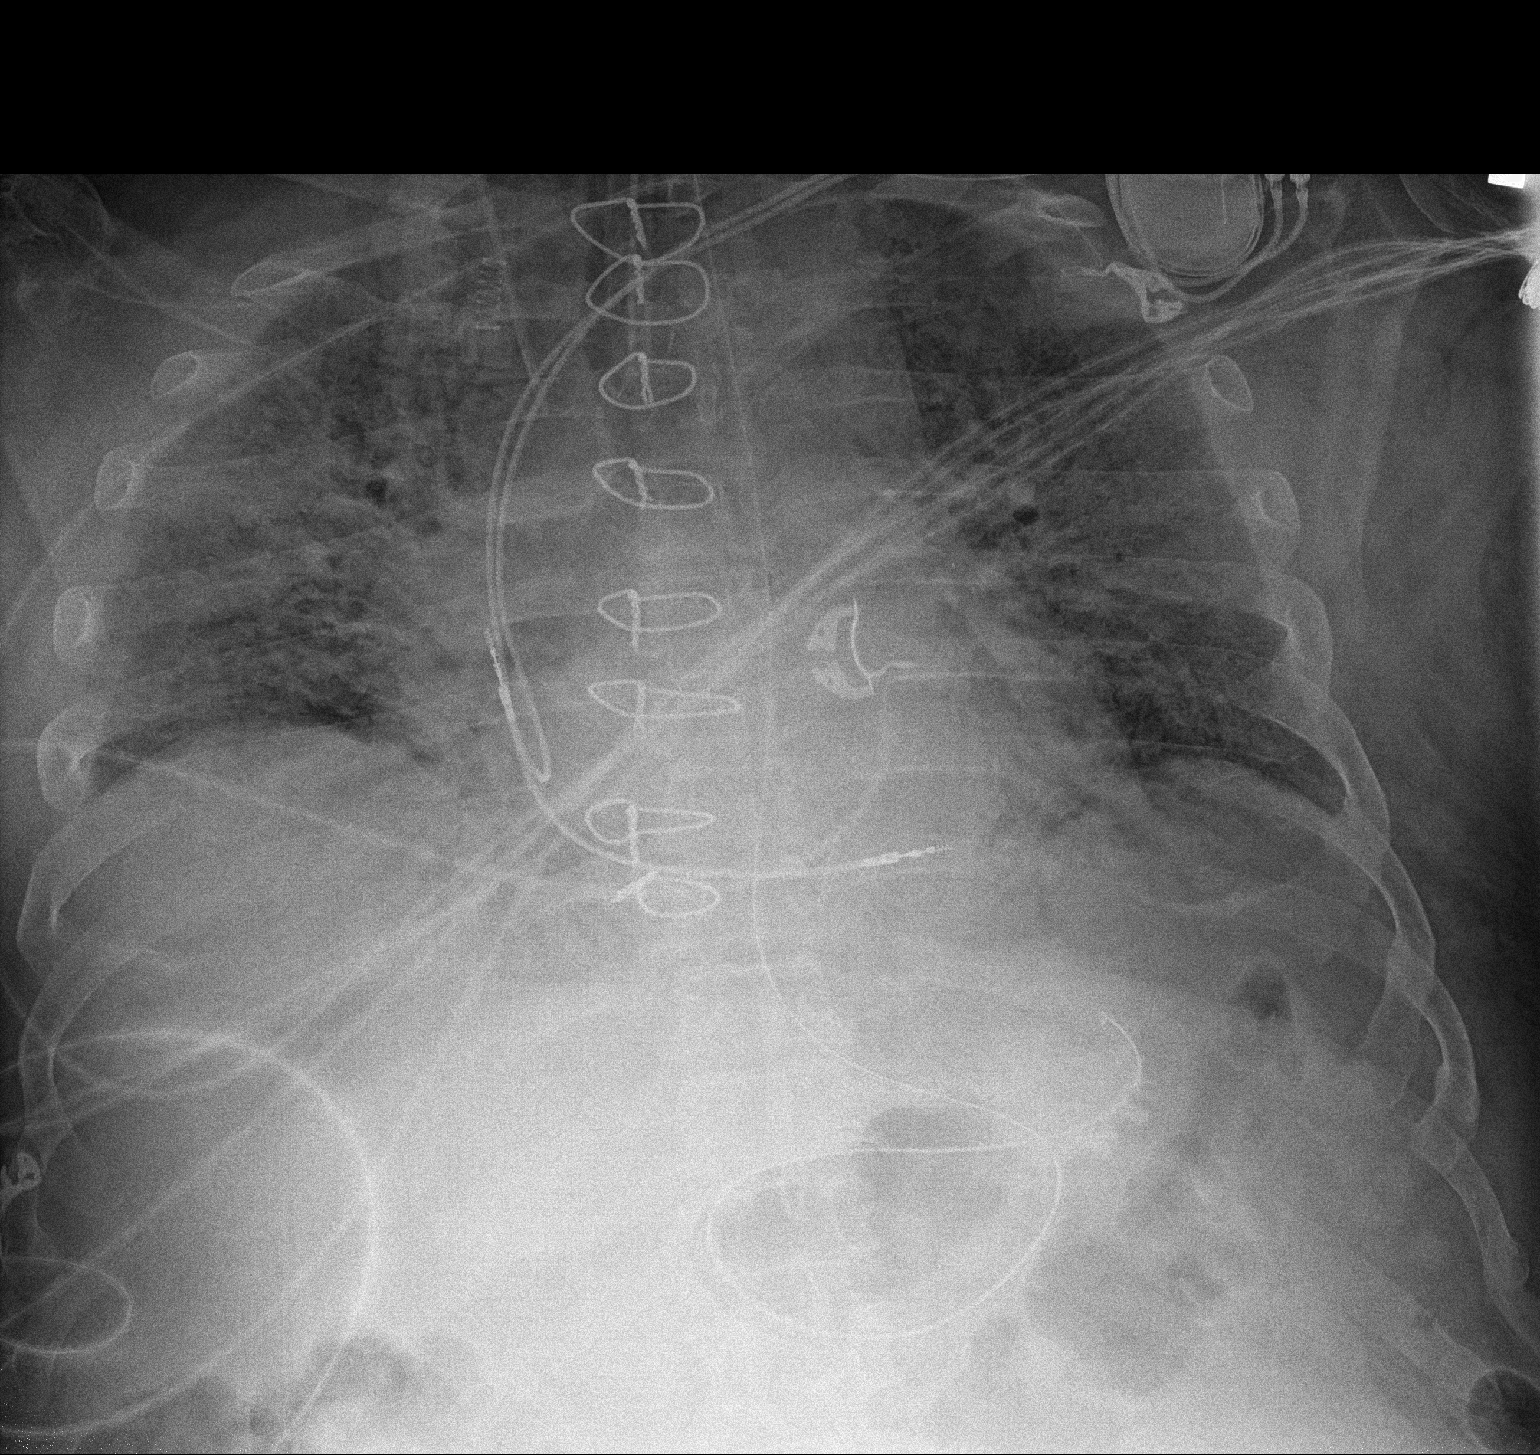

[1 of 1 positions shown; findings below may reference images not displayed]

FINDINGS: There is cardiomegaly. Endotracheal tube is seen with the tip 1.7 cm
above the level of the carina. The NG tube is coiled within the
stomach. A left-sided pacemaker seen with the lead tips in the right
atrium and right ventricle. Again noted are patchy ground-glass
opacities throughout both lungs. This is not significantly changed
since the prior.
IMPRESSION: 1. No significant change in the bilateral airspace opacities.
2. Cardiomegaly
3. Lines and tubes in appropriate position.

## 2021-02-23 IMAGING — US US RENAL
1 series · 14 of 25 positions shown · non-contrast
Comparison: None.

CLINICAL DATA: Acute renal failure.

EXAM:
RENAL / URINARY TRACT ULTRASOUND COMPLETE

[Series 1: us renal · 0.27mm/px · 14 of 108 slices shown]
[im 1/108]
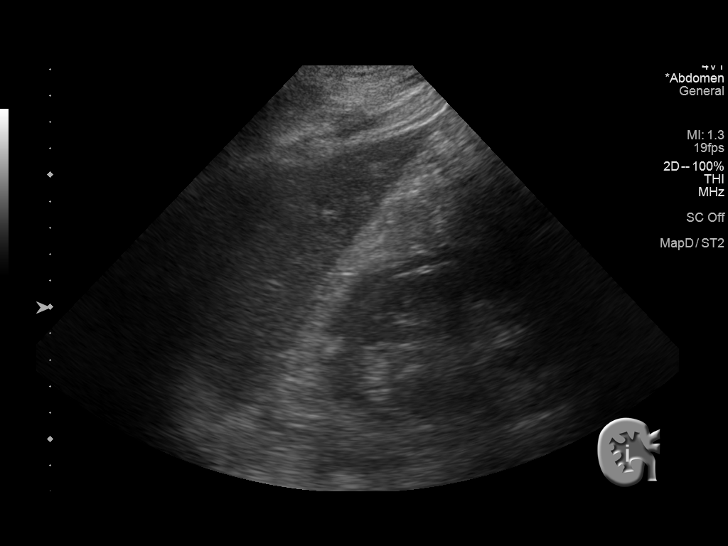
[im 9/108]
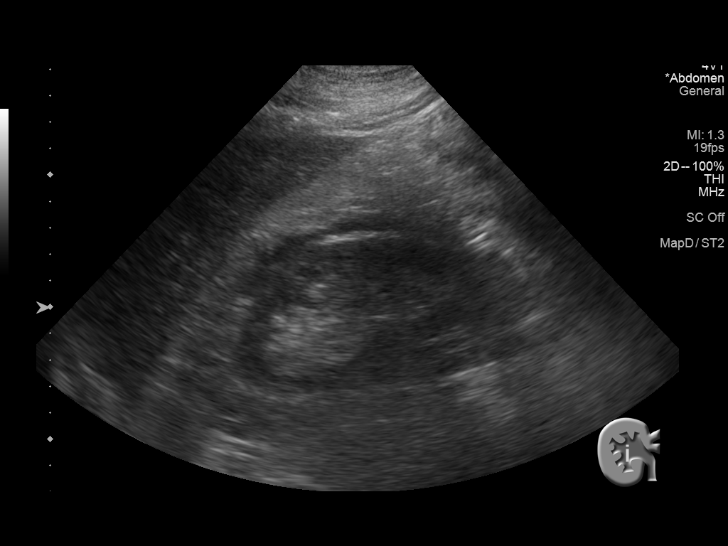
[im 18/108]
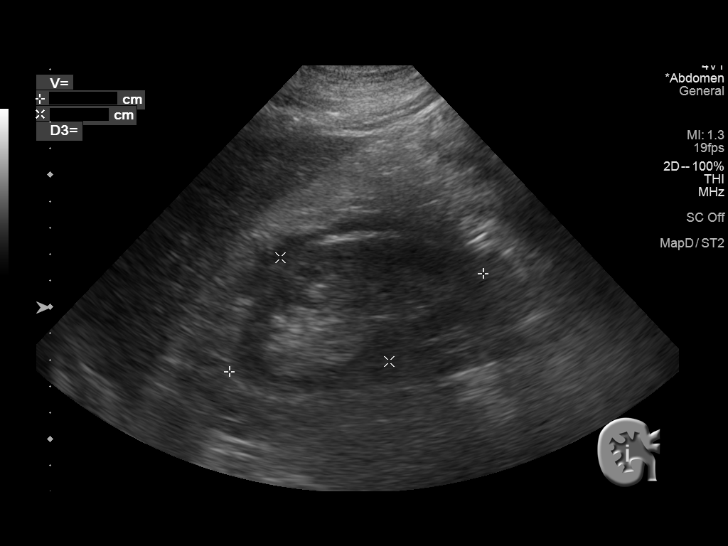
[im 27/108]
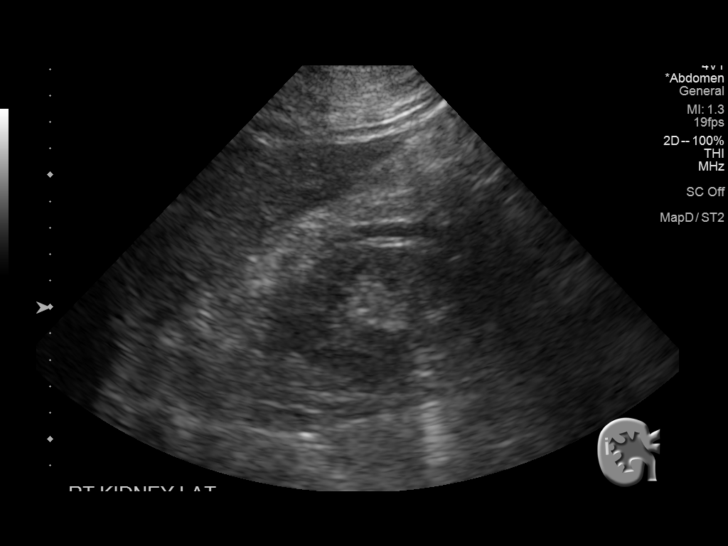
[im 36/108]
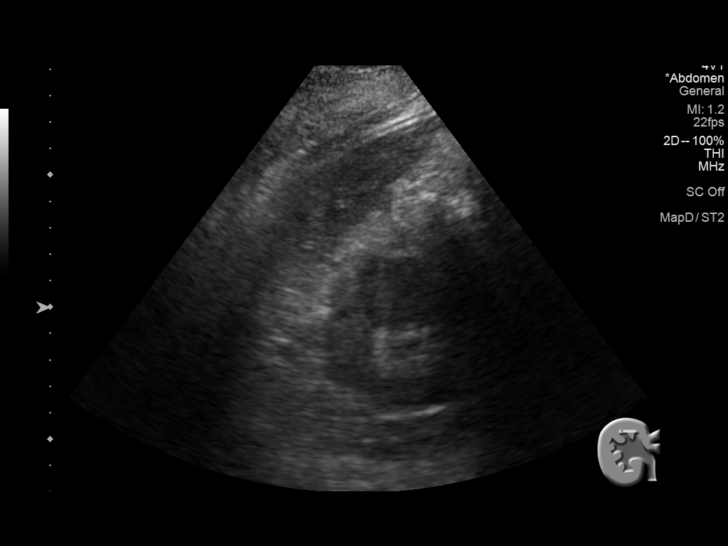
[im 41/108]
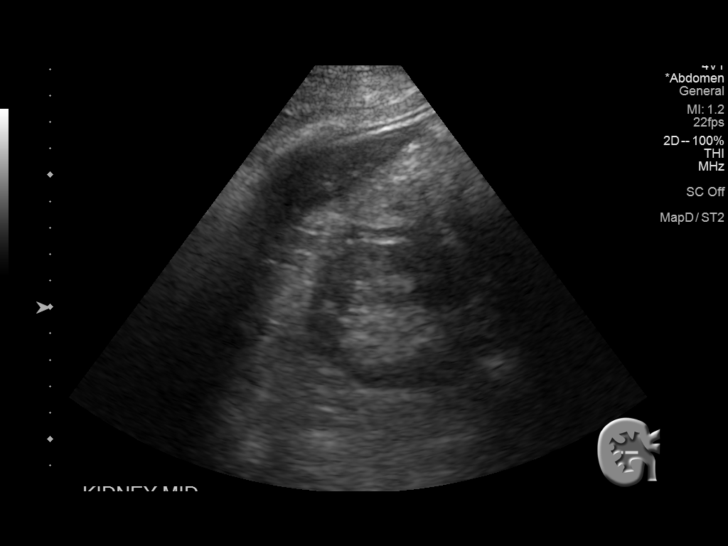
[im 50/108]
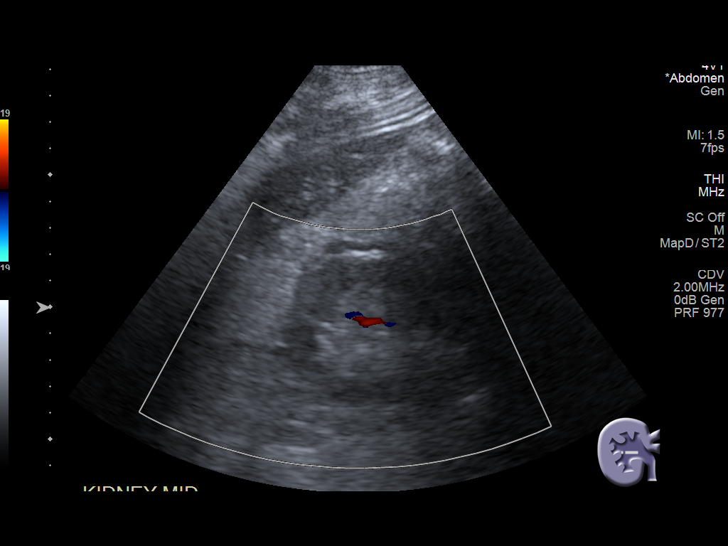
[im 58/108]
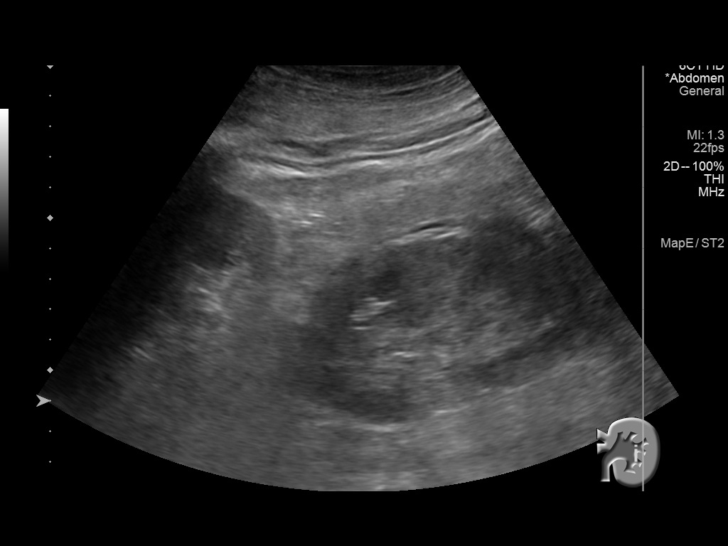
[im 67/108]
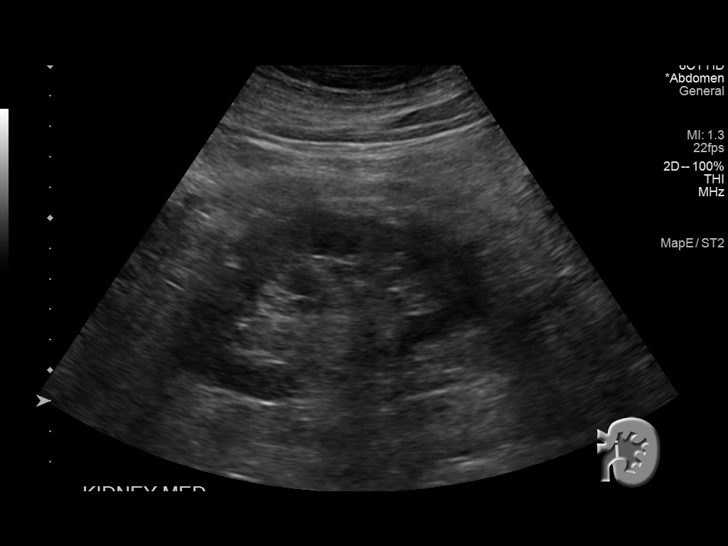
[im 72/108]
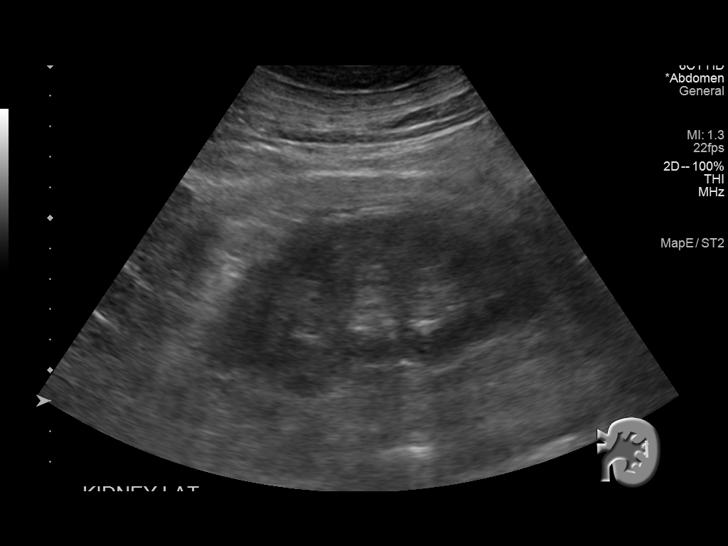
[im 81/108]
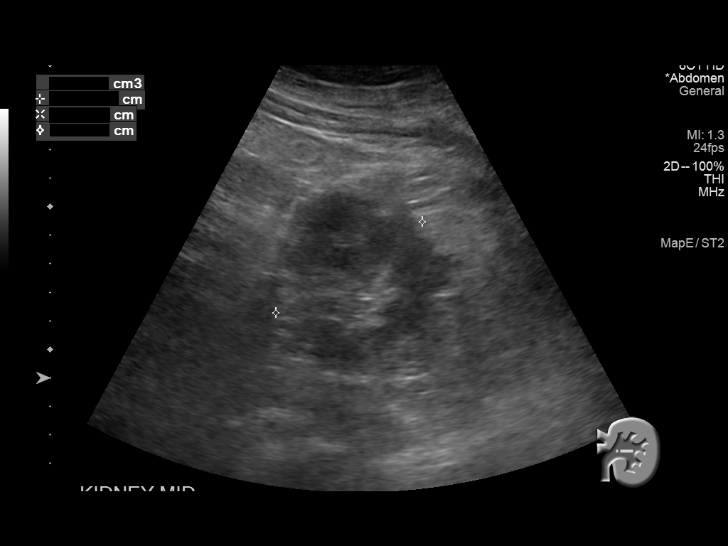
[im 90/108]
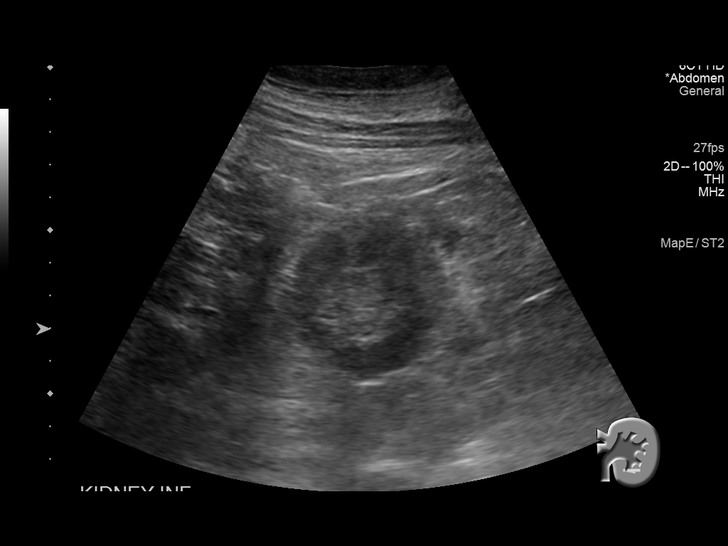
[im 99/108]
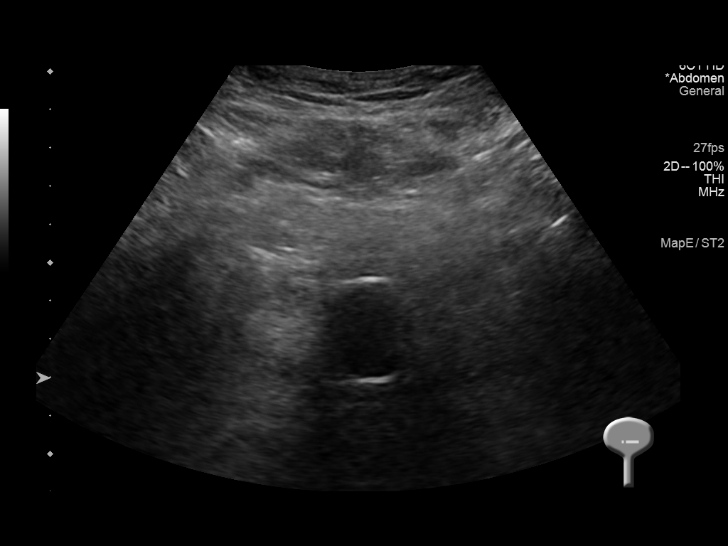
[im 108/108]
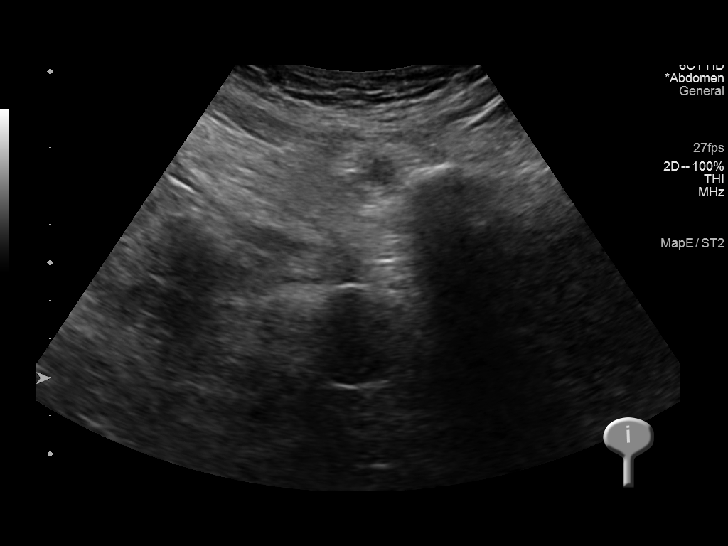

[14 of 25 positions shown; findings below may reference images not displayed]

FINDINGS: Right Kidney:

Renal measurements: 10.3 x 5.7 x 6.5 cm = volume: 200.7 mL .
Echogenicity within normal limits. No mass or hydronephrosis
visualized. Mild perinephric fluid.

Left Kidney:

Renal measurements: 11.2 x 6.0 x 6.0 cm = volume: 210.5 mL.
Echogenicity within normal limits. No mass or hydronephrosis
visualized. Mild perinephric fluid.

Bladder:

Decompressed with Foley catheter.
IMPRESSION: Small amount of perinephric fluid bilaterally.

No hydronephrosis.

## 2021-02-25 IMAGING — DX PORTABLE CHEST - 1 VIEW
1 series · 1 of 1 positions shown · non-contrast
Comparison: 06/06/2019.

CLINICAL DATA: Respiratory distress.

EXAM:
PORTABLE CHEST 1 VIEW

[chest ap]
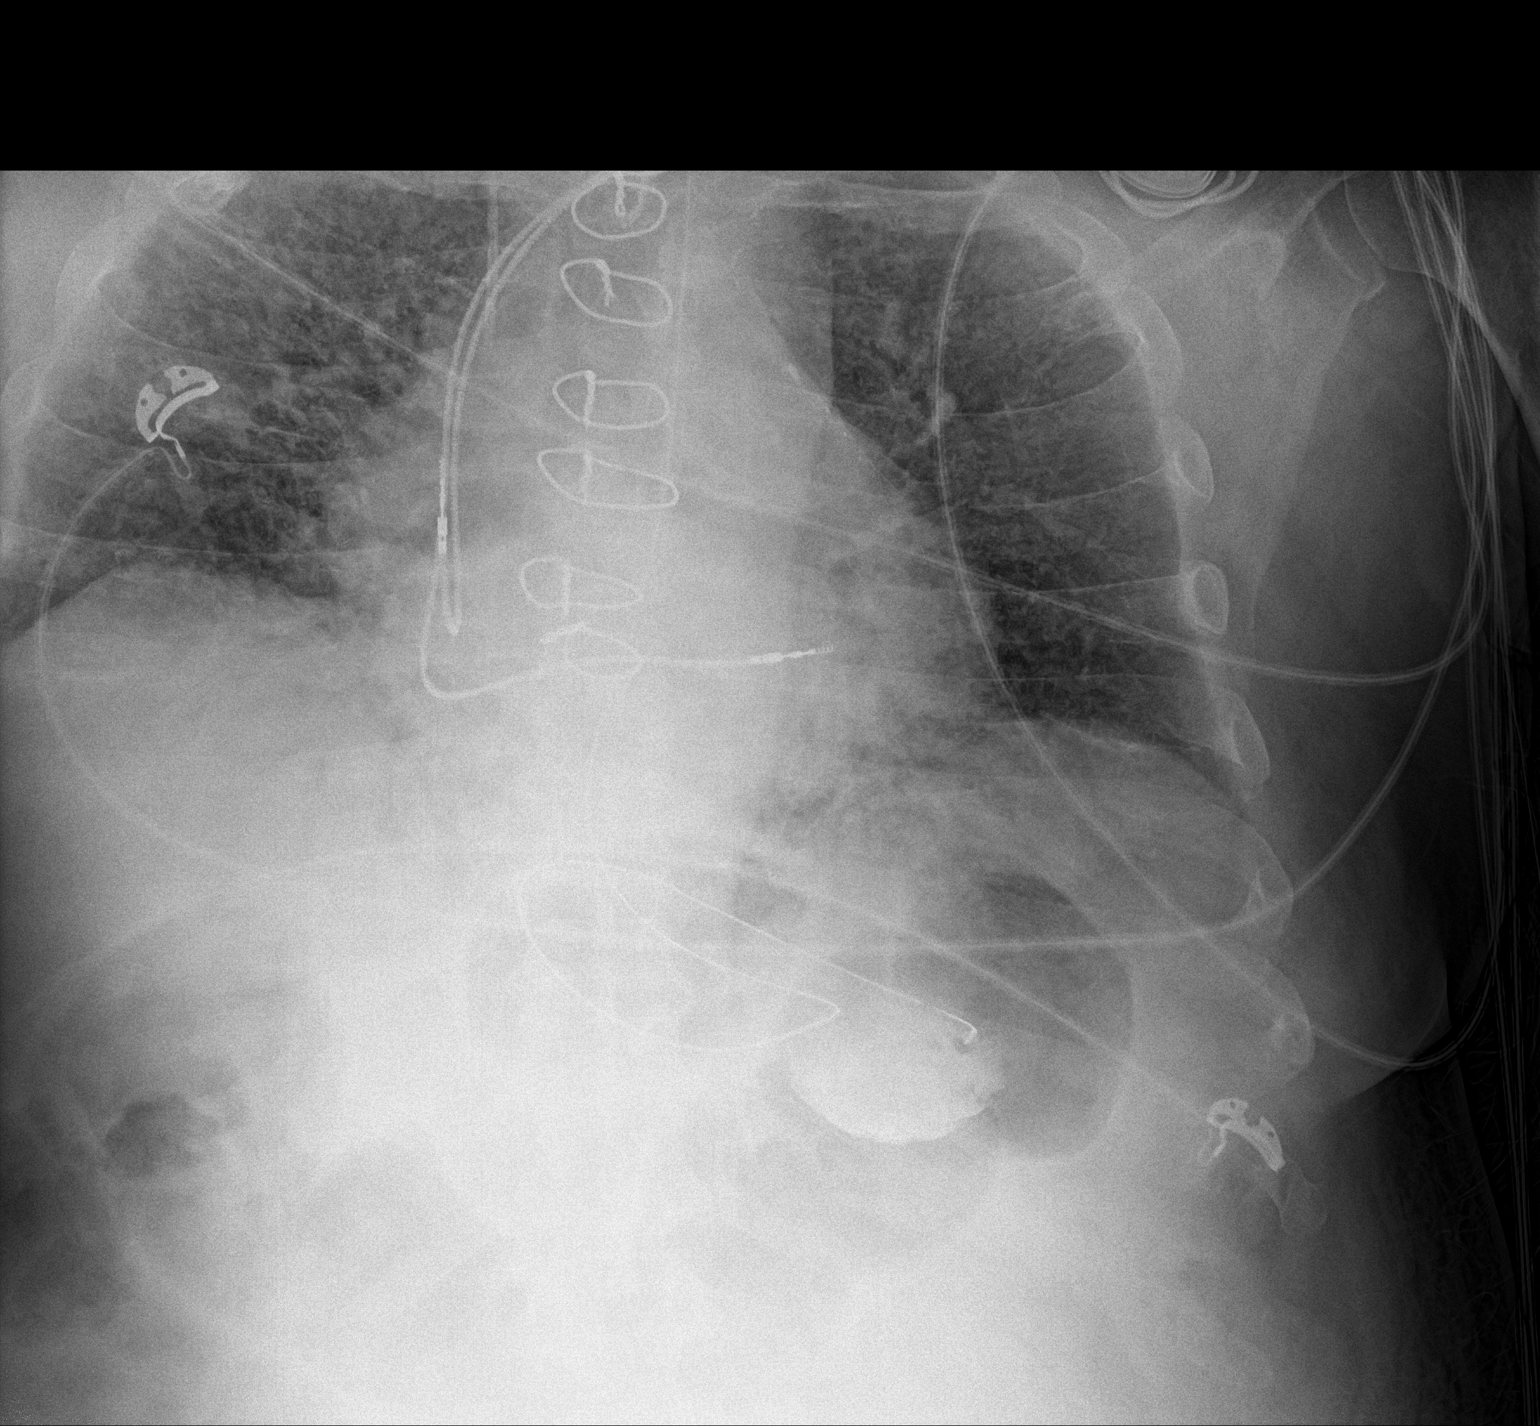

[1 of 1 positions shown; findings below may reference images not displayed]

FINDINGS: Endotracheal tube, NG tube, right IJ line stable position. Prior
median sternotomy. Cardiac pacer stable position. Heart size stable.
Diffuse bilateral pulmonary infiltrates/edema again noted. Interim
improvement from prior exam. No pleural effusion or pneumothorax.
Right costophrenic angle incompletely imaged. Density noted in the
stomach may represent contrast. No acute bony abnormality.
IMPRESSION: 1.  Lines and tubes stable position.

2. Diffuse bilateral pulmonary infiltrates/edema again noted.
Interim improvement from prior exam.
